# Patient Record
Sex: Female | Born: 1960 | Race: White | Hispanic: No | Marital: Married | Smoking: Never smoker
Health system: Southern US, Community
[De-identification: ages and names within clinical notes are randomized; demographics above are authoritative.]

## PROBLEM LIST (undated history)

## (undated) DIAGNOSIS — I10 Essential (primary) hypertension: Secondary | ICD-10-CM

## (undated) HISTORY — DX: Essential (primary) hypertension: I10

## (undated) HISTORY — PX: REDUCTION MAMMAPLASTY: SUR839

---

## 2006-07-24 ENCOUNTER — Other Ambulatory Visit: Admission: RE | Admit: 2006-07-24 | Discharge: 2006-07-24 | Payer: Self-pay | Admitting: Family Medicine

## 2007-02-28 ENCOUNTER — Ambulatory Visit (HOSPITAL_BASED_OUTPATIENT_CLINIC_OR_DEPARTMENT_OTHER): Admission: RE | Admit: 2007-02-28 | Discharge: 2007-02-28 | Payer: Self-pay | Admitting: Orthopedic Surgery

## 2008-09-17 ENCOUNTER — Other Ambulatory Visit: Admission: RE | Admit: 2008-09-17 | Discharge: 2008-09-17 | Payer: Self-pay | Admitting: Family Medicine

## 2011-01-18 NOTE — Op Note (Signed)
Jane Wong, Jane Wong NO.:  [id number]   MEDICAL RECORD NO.:  [id number]          PATIENT TYPE:  AMB   LOCATION:  DSC                          FACILITY:  [facility]   PHYSICIAN:  Cindee Salt, M.D.       DATE OF BIRTH:  08-06-1961   DATE OF PROCEDURE:  02/28/2007  DATE OF DISCHARGE:                               OPERATIVE REPORT   PREOPERATIVE DIAGNOSIS:  Carpal tunnel syndrome right hand, stenosing  tenosynovitis, right thumb.   POSTOPERATIVE DIAGNOSIS:  Carpal tunnel syndrome right hand, stenosing  tenosynovitis, right thumb.   OPERATION:  Release A1 pulley right thumb, carpal tunnel release right  hand.   SURGEON:  Cindee Salt, M.D.   ASSISTANT:  Carolyne Fiscal R.N.   ANESTHESIA:  Forearm based IV regional.   HISTORY:  The patient is a 50 year old female with history of carpal  tunnel syndrome, EMG nerve conductions positive.  This has not responded  to conservative treatment.  She also has triggering of her right thumb  which has also not responded, she is desirous of surgical release.  She  is aware of risks and complications including infection, recurrence,  injury to arteries, nerves, tendons, incomplete relief of symptoms,  dystrophy.  She has elected to proceed.  Questions have been encouraged  and answered in the preoperative area.  The extremity is marked by both  the patient and surgeon.  Antibiotic given, questions again encouraged  and answered.   PROCEDURE:  The patient was brought to the operating room where forearm  based IV regional anesthetic was carried out without difficulty.  She  was prepped using DuraPrep, supine position, right arm free.  A  longitudinal incision was made in the palm, carried down through  subcutaneous tissue.  Bleeders were electrocauterized palmar fascia was  split, superficial palmar arch identified, flexor tendon to the ring and  little finger identified, to the ulnar side of median nerve.  Carpal  retinaculum was incised  with sharp dissection.  A right-angle and Sewall  retractor were placed between skin and the forearm, the forearm fascia  was released for approximately a centimeter and half proximal to the  wrist crease under direct vision.  Canal was explored.  No further  lesions were identified.  Area compression to the nerve was apparent.  The motor branch was noted, found to be intact, wound was irrigated.  Skin closed interrupted 5-0 Vicryl Rapide sutures.  A separate incision  was then made over the A1 pulley of the right thumb, carried down  through subcutaneous tissues transverse just proximal to the  metacarpophalangeal joint crease  neurovascular bundles were identified  and protected.  Retractors placed.  An incision was then made on the  radial aspect of the A1 pulley.  The oblique pulley was left intact.  Thumb was placed through full range motion, no further triggering or  constriction was noted.  The wound was irrigated and skin closed with  interrupted 5-0 Vicryl Rapide  sutures.  Sterile compressive dressing and splint applied.  The patient  tolerated the procedure well was taken to the  recovery for observation  in satisfactory condition.  She is discharged home to return to [facility] [place] in one week on Vicodin.           ______________________________  Cindee Salt, M.D.     GK/MEDQ  D:  02/28/2007  T:  02/28/2007  Job:  [id number]

## 2011-06-22 LAB — POCT HEMOGLOBIN-HEMACUE
Hemoglobin: 13.4
Operator id: 116011

## 2014-11-27 ENCOUNTER — Other Ambulatory Visit: Payer: Self-pay | Admitting: Family Medicine

## 2014-11-27 ENCOUNTER — Other Ambulatory Visit (HOSPITAL_COMMUNITY)
Admission: RE | Admit: 2014-11-27 | Discharge: 2014-11-27 | Disposition: A | Discharge status: Home / Self Care | Payer: 59 | Source: Ambulatory Visit | Attending: Family Medicine | Admitting: Family Medicine

## 2014-11-27 DIAGNOSIS — Z01419 Encounter for gynecological examination (general) (routine) without abnormal findings: Secondary | ICD-10-CM | POA: Insufficient documentation

## 2014-11-28 LAB — CYTOLOGY - PAP

## 2015-02-13 ENCOUNTER — Other Ambulatory Visit: Payer: Self-pay | Admitting: Gastroenterology

## 2015-04-14 ENCOUNTER — Ambulatory Visit (INDEPENDENT_AMBULATORY_CARE_PROVIDER_SITE_OTHER): Payer: 59

## 2015-04-14 ENCOUNTER — Encounter: Payer: Self-pay | Admitting: Podiatry

## 2015-04-14 ENCOUNTER — Ambulatory Visit (INDEPENDENT_AMBULATORY_CARE_PROVIDER_SITE_OTHER): Payer: 59 | Admitting: Podiatry

## 2015-04-14 VITALS — BP 146/88 | HR 67 | Resp 12

## 2015-04-14 DIAGNOSIS — M7742 Metatarsalgia, left foot: Secondary | ICD-10-CM | POA: Diagnosis not present

## 2015-04-14 DIAGNOSIS — G5762 Lesion of plantar nerve, left lower limb: Secondary | ICD-10-CM | POA: Diagnosis not present

## 2015-04-14 DIAGNOSIS — M7741 Metatarsalgia, right foot: Secondary | ICD-10-CM | POA: Diagnosis not present

## 2015-04-14 DIAGNOSIS — R52 Pain, unspecified: Secondary | ICD-10-CM

## 2015-04-14 MED ORDER — DEXAMETHASONE SODIUM PHOSPHATE 120 MG/30ML IJ SOLN
4.0000 mg | Freq: Once | INTRAMUSCULAR | Status: AC
  Administered 2015-04-14: 4 mg via INTRA_ARTICULAR

## 2015-04-14 MED ORDER — TRIAMCINOLONE ACETONIDE 10 MG/ML IJ SUSP: 10.0000 mg | Freq: Once | INTRAMUSCULAR | Status: AC

## 2015-04-14 NOTE — Progress Notes (Signed)
   Subjective:    Patient ID: Jane Wong, female    DOB: Feb 01, 1961, 54 y.o.   MRN: [id number]  HPI  As patient presents today complaining of bilateral pain and numbness in the second through fourth MPJ toe area left greater than right. The symptoms occur upon physical activity such as tennis as well as walking. The symptoms are relieved when she sits down and rest over a period of several hours. Symptoms are aggravated with weightbearing and relieved with rest. She patient describes wearing appropriate shoes for the activity that she participates in. More recently she is reduce her tennis and walking because of foot pain. She denies any other areas on her body that have pain or numbness other than her feet. The symptoms are mildly progressing over time  Review of Systems  HENT: Positive for sneezing and sore throat.   Respiratory: Positive for cough.   All other systems reviewed and are negative.      Objective:   Physical Exam  Orientated 3  Vascular: No peripheral edema noted bilaterally DP and PT pulses 2/4 bilaterally Capillary reflex immediate bilaterally  Neurological: Sensation to 10 g monofilament wire intact 5/5 bilaterally Vibratory sensation reactive bilaterally Ankle reflex equal and reactive bilaterally  Dermatological: Texture and turgor within normal limits bilaterally Well-healed surgical scars dorsal aspect first MPJ bilaterally  Musculoskeletal: No restriction ankle, subtalar, midtarsal joints bilaterally Mild pain around upon range of motion second and fourth MPJ left with no crepitus or restriction Exquisite palpable tenderness second and third left intermetatarsal spaces with lesser tenderness in the second and third right intermetatarsal spaces. No palpable lesions in these intermetatarsal spaces bilaterally Palpable tenderness in the plantar sulcus area second third MPJ left greater than right without any palpable lesions  X-ray examination  today Retained internal fixation 2 first metatarsals bilaterally Hammertoe second left Posterior and inferior calcaneal spurs bilaterally Normal bone and joint spaces bilaterally       Assessment & Plan:   Assessment: Satisfactory neurovascular status Suspect Morton's neuroma third left interspace and neuroma second left intermetatarsal space Suspect Morton's neuroma third right intermetatarsal space and second right intermetatarsal space (however these areas are minimally tender to palpation without any palpable lesions) Metatarsalgia bilaterally  Plan: I reviewed the results of patient's exam and x-rays today Offered patient dexamethasone injection into the second third intermetatarsal spaces and she verbally consents. Skin is prepped with alcohol and Betadine and 2 mg of dexamethasone phosphate and 5 mg of plain Xylocaine were injected each second and third left intermetatarsal spaces for dexamethasone injection #1  Felt surgical metatarsal pads dispensed for patient to wear in her walking or running shoes with user instruction provided  We'll reevaluate 2 weeks. I do think the patient  candidate for accommodative orthotics metatarsal raises 2-4

## 2015-04-14 NOTE — Patient Instructions (Signed)
Morton's Neuroma Neuralgia (nerve pain) or neuroma (benign [non-cancerous] nerve tumor) may develop on any interdigital nerve. The interdigital nerves (nerves between digits) of the foot travel beneath and between the metatarsals (long bones of the fore foot) and pass the nerve endings to the toes. The third interdigital is a common place for a small neuroma to form called Morton's neuroma. Another nerve to be affected commonly is the fourth interdigital nerve. This would be in approximately in the area of the base or ball under the bottom of your fourth toe. This condition occurs more commonly in women and is usually on one side. It is usually first noticed by pain radiating (spreading) to the ball of the foot or to the toes. CAUSES The cause of interdigital neuralgia may be from low grade repetitive trauma (damage caused by an accident) as in activities causing a repeated pounding of the foot (running, jumping etc.). It is also caused by improper footwear or recent loss of the fatty padding on the bottom of the foot. TREATMENT  The condition often resolves (goes away) simply with decreasing activity if that is thought to be the cause. Proper shoes are beneficial. Orthotics (special foot support aids) such as a metatarsal bar are often beneficial. This condition usually responds to conservative therapy, however if surgery is necessary it usually brings complete relief. HOME CARE INSTRUCTIONS   Apply ice to the area of soreness for 15-20 minutes, 03-04 times per day, while awake for the first 2 days. Put ice in a plastic bag and place a towel between the bag of ice and your skin.  Only take over-the-counter or prescription medicines for pain, discomfort, or fever as directed by your caregiver. MAKE SURE YOU:   Understand these instructions.  Will watch your condition.  Will get help right away if you are not doing well or get worse. Document Released: 11/28/2000 Document Revised: 11/14/2011  Document Reviewed: 10/23/2013 [facility] Patient Information 2015 [place], [place]. This information is not intended to replace advice given to you by your health care provider. Make sure you discuss any questions you have with your health care provider.

## 2015-04-29 ENCOUNTER — Ambulatory Visit (INDEPENDENT_AMBULATORY_CARE_PROVIDER_SITE_OTHER): Payer: 59 | Admitting: Podiatry

## 2015-04-29 ENCOUNTER — Encounter: Payer: Self-pay | Admitting: Podiatry

## 2015-04-29 VITALS — BP 137/74 | HR 60 | Resp 12

## 2015-04-29 DIAGNOSIS — M7742 Metatarsalgia, left foot: Secondary | ICD-10-CM | POA: Diagnosis not present

## 2015-04-29 DIAGNOSIS — M722 Plantar fascial fibromatosis: Secondary | ICD-10-CM | POA: Diagnosis not present

## 2015-04-29 DIAGNOSIS — G5762 Lesion of plantar nerve, left lower limb: Secondary | ICD-10-CM

## 2015-04-29 DIAGNOSIS — M7741 Metatarsalgia, right foot: Secondary | ICD-10-CM | POA: Diagnosis not present

## 2015-04-29 NOTE — Patient Instructions (Signed)
Today we obtained a digital scan to make custom foot orthotics to treat your symptoms that seem to be most consistent with diffuse metatarsalgia (achiness in the ball areas of your feet). In addition you may have some low-grade symptoms of neuromas which are swollen nerves. Our office will contact you will receive the custom foot orthotics

## 2015-04-29 NOTE — Progress Notes (Signed)
   Subjective:    Patient ID: Jane Wong, female    DOB: [birth year], 54 y.o.   MRN: [id number]  HPI this patient presents for follow-up visit of 04/14/2015. At that time dexamethasone phosphate was injected in divided doses into the second and third left intermetatarsal spaces. Patient states for approximately 10 days she had complete relief. Now patient states that she has soreness and occasional shooting pain that occurs after she plays tennis or pulmonary prolonged walking or at toe off and a plantar lesser MPJ area. At this time her symptoms have improved partially from the dexamethasone injection, however, the symptoms seem to be gradually becoming more uncomfortable over time. Patient states that the metatarsal pads that were dispensed on the visit of 04/14/2015 felt comfortable and reduce some of the symptoms in the forefoot area  Review of Systems  All other systems reviewed and are negative.      Objective:   Physical Exam  Orientated 3  Vascular: DP and PT pulses 2/4 bilaterally  Neurological: Deferred  Dermatological: Texture and turgor within normal limits  Musculoskeletal: Palpable tenderness second third left intermetatarsal space without any palpable lesions Plantar basis second third MPJ left without any palpable lesions There is no tenderness on range of motion lesser MPJ left  Mild palpable tenderness second third right intermetatarsal spaces without any palpable lesions Mild palpable tenderness plantar basis second to fourth MPJ right without any palpable lesions There is no tenderness on range of motion MPJ right    Assessment & Plan:   Assessment: Mixed symptoms of metatarsalgia left greater than right Low-grade neuroma second and third intermetatarsal spaces left greater than right  Plan: I reviewed the results of today's examination responsive dexamethasone phosphate injection. At at this time I recommended an accommodative orthotic with a metatarsal  raise for the indication of metatarsalgia and Morton's neuroma bilaterally. patient verbally consents for accommodative orthotics  Rx custom foot orthotics Cushion sport No heel post Pelite blue cover perforated Metatarsal raise 2-4  Notify patient upon receipt of orthotic

## 2015-05-29 ENCOUNTER — Ambulatory Visit: Payer: 59 | Admitting: *Deleted

## 2015-05-29 DIAGNOSIS — R52 Pain, unspecified: Secondary | ICD-10-CM

## 2015-05-29 NOTE — Patient Instructions (Signed)

## 2015-05-29 NOTE — Progress Notes (Signed)
Patient ID: Jane Wong, female   DOB: 07-11-1961, 54 y.o.   MRN: [id number] Patient presents for orthotic pick up.  Verbal and written break in and wear instructions given.  Patient will follow up in 4 weeks if symptoms worsen or fail to improve.

## 2015-10-20 ENCOUNTER — Other Ambulatory Visit: Payer: Self-pay

## 2015-10-20 DIAGNOSIS — Z1231 Encounter for screening mammogram for malignant neoplasm of breast: Secondary | ICD-10-CM

## 2015-11-06 ENCOUNTER — Ambulatory Visit
Admission: RE | Admit: 2015-11-06 | Discharge: 2015-11-06 | Disposition: A | Discharge status: Home / Self Care | Payer: 59 | Source: Ambulatory Visit

## 2015-11-06 DIAGNOSIS — Z1231 Encounter for screening mammogram for malignant neoplasm of breast: Secondary | ICD-10-CM

## 2015-11-10 ENCOUNTER — Other Ambulatory Visit: Payer: Self-pay | Admitting: Family Medicine

## 2015-11-10 DIAGNOSIS — R928 Other abnormal and inconclusive findings on diagnostic imaging of breast: Secondary | ICD-10-CM

## 2015-11-16 ENCOUNTER — Ambulatory Visit
Admission: RE | Admit: 2015-11-16 | Discharge: 2015-11-16 | Disposition: A | Discharge status: Home / Self Care | Payer: 59 | Source: Ambulatory Visit | Attending: Family Medicine | Admitting: Family Medicine

## 2015-11-16 DIAGNOSIS — R928 Other abnormal and inconclusive findings on diagnostic imaging of breast: Secondary | ICD-10-CM

## 2016-09-05 DIAGNOSIS — R1084 Generalized abdominal pain: Secondary | ICD-10-CM | POA: Diagnosis not present

## 2016-09-07 DIAGNOSIS — K219 Gastro-esophageal reflux disease without esophagitis: Secondary | ICD-10-CM | POA: Diagnosis not present

## 2016-12-20 ENCOUNTER — Other Ambulatory Visit: Payer: Self-pay | Admitting: Family Medicine

## 2016-12-20 DIAGNOSIS — Z1231 Encounter for screening mammogram for malignant neoplasm of breast: Secondary | ICD-10-CM

## 2017-01-06 ENCOUNTER — Ambulatory Visit
Admission: RE | Admit: 2017-01-06 | Discharge: 2017-01-06 | Disposition: A | Discharge status: Home / Self Care | Payer: 59 | Source: Ambulatory Visit | Attending: Family Medicine | Admitting: Family Medicine

## 2017-01-06 DIAGNOSIS — Z1231 Encounter for screening mammogram for malignant neoplasm of breast: Secondary | ICD-10-CM | POA: Diagnosis not present

## 2017-04-13 DIAGNOSIS — M778 Other enthesopathies, not elsewhere classified: Secondary | ICD-10-CM | POA: Diagnosis not present

## 2017-05-16 ENCOUNTER — Ambulatory Visit (INDEPENDENT_AMBULATORY_CARE_PROVIDER_SITE_OTHER): Payer: 59 | Admitting: Podiatry

## 2017-05-16 ENCOUNTER — Other Ambulatory Visit: Payer: Self-pay | Admitting: Podiatry

## 2017-05-16 ENCOUNTER — Ambulatory Visit (INDEPENDENT_AMBULATORY_CARE_PROVIDER_SITE_OTHER): Payer: 59

## 2017-05-16 ENCOUNTER — Encounter: Payer: Self-pay | Admitting: Podiatry

## 2017-05-16 VITALS — BP 147/93 | HR 57

## 2017-05-16 DIAGNOSIS — R52 Pain, unspecified: Secondary | ICD-10-CM

## 2017-05-16 DIAGNOSIS — T847XXA Infection and inflammatory reaction due to other internal orthopedic prosthetic devices, implants and grafts, initial encounter: Secondary | ICD-10-CM

## 2017-05-16 DIAGNOSIS — M722 Plantar fascial fibromatosis: Secondary | ICD-10-CM

## 2017-05-16 NOTE — Progress Notes (Signed)
   Subjective:    Patient ID: Jane Wong, female    DOB: 1961/05/31, 56 y.o.   MRN: [id number]  HPI This patient presents today for 2 problems. The first complaint is a not with occasional discomfort in the incisional line on the dorsal right first MPJ area. This area areas intermittently uncomfortable with direct pressure shoe wearing. Patient has history of bilateral bunionectomy with internal K wire fixation approximately 10+ years ago. The second complaint is too weak history of inferior left heel pain uncomfortable with weightbearing and relieved with rest and elevation. Symptoms are exacerbated with physical activity. Patient has attempted to change shoes and stretching  persistent symptoms. Patient has pending your pain vacations scheduled   Review of Systems  All other systems reviewed and are negative.      Objective:   Physical Exam  Orientated 3  Vascular: DP and PT pulses 2/4 bilaterally Capillary reflex within normal limits bilaterally  Neurological: Sensation to 10 g monofilament wire intact 5/5 bilaterally Vibratory sensation reactive bilaterally Ankle reflexes reactive bilaterally  Dermatological: No skin lesions bilaterally Well-healed surgical scars dorsal first MPJ bilaterally Palpable thickening in the distal one third of the incisional site right first metatarsal which duplicates patient's area of discomfort  Musculoskeletal: Palpable tenderness medial plantar fascial insertional area left without any palpable lesions. This duplicates area of discomfort in the left heel  There is no restriction ankle, subtalar, midtarsal joints bilaterally  X-ray examination weightbearing left foot dated 05/16/2017  Intact bony structure without fracture and/or dislocation Retained internal K wires 2 had of first metatarsal Evidence of healed osteotomy first metatarsal Inferior calcaneal spur  Radiographic impression: No acute bony abnormality noted weightbearing  x-ray of the left foot dated 05/16/2017  X-ray examination weightbearing right foot  Intact bony structure without fracture and/or dislocation Retained internal K wire fixation 2 in good position Overlying circular metallic wires demonstrates the area of fibrosis or scarring overlies the K wires Evidence of healed osteotomy first metatarsal and bunionectomy Inferior calcaneal spur Posterior calcaneal spur  Radiographic impression: No acute bony abnormality weightbearing x-ray in the right foot Area of discomfort overlies the K wires which are in satisfactory position      Assessment & Plan:   Assessment: 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 I told her that if this area was consistent uncomfortable I would recommend removal of the K wires. At this time with patient's pending your pain vacations she will defer in any decision regards to removal of the K wires  Offered patient steroid injection for plantar fasciitis left. Patient verbally consents Skin is prepped with alcohol and Betadine and 10 mg of Kenalog injected with 5 mg of plain Xylocaine and 2.5 mg of plain Sensorcaine were injected inferior heel left for Kenalog injection #1. Patient tolerated procedure without any difficulty A fasciitis strap was dispensed to wear and left foot Shoeing and stretching discussed  Reappoint at patient's request

## 2017-05-16 NOTE — Patient Instructions (Signed)
Wear the fasciitis strap over a sock in athletic lace up style shoe Okay to wear existing orthotic plus the fasciitis strap If the internal fixation on the right foot is uncomfortable on a consistent basis and schedule appointment to have the internal fixation removed  Plantar Fasciitis Plantar fasciitis is a painful foot condition that affects the heel. It occurs when the band of tissue that connects the toes to the heel bone (plantar fascia) becomes irritated. This can happen after exercising too much or doing other repetitive activities (overuse injury). The pain from plantar fasciitis can range from mild irritation to severe pain that makes it difficult for you to walk or move. The pain is usually worse in the morning or after you have been sitting or lying down for a while. What are the causes? This condition may be caused by:  Standing for long periods of time.  Wearing shoes that do not fit.  Doing high-impact activities, including running, aerobics, and ballet.  Being overweight.  Having an abnormal way of walking (gait).  Having tight calf muscles.  Having high arches in your feet.  Starting a new athletic activity.  What are the signs or symptoms? The main symptom of this condition is heel pain. Other symptoms include:  Pain that gets worse after activity or exercise.  Pain that is worse in the morning or after resting.  Pain that goes away after you walk for a few minutes.  How is this diagnosed? This condition may be diagnosed based on your signs and symptoms. Your health care provider will also do a physical exam to check for:  A tender area on the bottom of your foot.  A high arch in your foot.  Pain when you move your foot.  Difficulty moving your foot.  You may also need to have imaging studies to confirm the diagnosis. These can include:  X-rays.  Ultrasound.  MRI.  How is this treated? Treatment for plantar fasciitis depends on the severity of  the condition. Your treatment may include:  Rest, ice, and over-the-counter pain medicines to manage your pain.  Exercises to stretch your calves and your plantar fascia.  A splint that holds your foot in a stretched, upward position while you sleep (night splint).  Physical therapy to relieve symptoms and prevent problems in the future.  Cortisone injections to relieve severe pain.  Extracorporeal shock wave therapy (ESWT) to stimulate damaged plantar fascia with electrical impulses. It is often used as a last resort before surgery.  Surgery, if other treatments have not worked after 12 months.  Follow these instructions at home:  Take medicines only as directed by your health care provider.  Avoid activities that cause pain.  Roll the bottom of your foot over a bag of ice or a bottle of cold water. Do this for 20 minutes, 3-4 times a day.  Perform simple stretches as directed by your health care provider.  Try wearing athletic shoes with air-sole or gel-sole cushions or soft shoe inserts.  Wear a night splint while sleeping, if directed by your health care provider.  Keep all follow-up appointments with your health care provider. How is this prevented?  Do not perform exercises or activities that cause heel pain.  Consider finding low-impact activities if you continue to have problems.  Lose weight if you need to. The best way to prevent plantar fasciitis is to avoid the activities that aggravate your plantar fascia. Contact a health care provider if:  Your symptoms do  not go away after treatment with home care measures.  Your pain gets worse.  Your pain affects your ability to move or do your daily activities. This information is not intended to replace advice given to you by your health care provider. Make sure you discuss any questions you have with your health care provider. Document Released: 05/17/2001 Document Revised: 01/25/2016 Document Reviewed:  07/02/2014 Elsevier Interactive Patient Education  [employer].

## 2017-06-29 DIAGNOSIS — M722 Plantar fascial fibromatosis: Secondary | ICD-10-CM | POA: Diagnosis not present

## 2017-07-03 DIAGNOSIS — M722 Plantar fascial fibromatosis: Secondary | ICD-10-CM | POA: Diagnosis not present

## 2017-07-06 DIAGNOSIS — S60012A Contusion of left thumb without damage to nail, initial encounter: Secondary | ICD-10-CM | POA: Diagnosis not present

## 2017-07-10 DIAGNOSIS — M722 Plantar fascial fibromatosis: Secondary | ICD-10-CM | POA: Diagnosis not present

## 2017-07-13 DIAGNOSIS — M722 Plantar fascial fibromatosis: Secondary | ICD-10-CM | POA: Diagnosis not present

## 2017-07-18 DIAGNOSIS — M722 Plantar fascial fibromatosis: Secondary | ICD-10-CM | POA: Diagnosis not present

## 2017-07-25 DIAGNOSIS — M722 Plantar fascial fibromatosis: Secondary | ICD-10-CM | POA: Diagnosis not present

## 2017-08-01 DIAGNOSIS — M722 Plantar fascial fibromatosis: Secondary | ICD-10-CM | POA: Diagnosis not present

## 2017-08-04 DIAGNOSIS — S60012D Contusion of left thumb without damage to nail, subsequent encounter: Secondary | ICD-10-CM | POA: Diagnosis not present

## 2017-08-08 DIAGNOSIS — M722 Plantar fascial fibromatosis: Secondary | ICD-10-CM | POA: Diagnosis not present

## 2017-08-15 DIAGNOSIS — M722 Plantar fascial fibromatosis: Secondary | ICD-10-CM | POA: Diagnosis not present

## 2017-08-21 DIAGNOSIS — M722 Plantar fascial fibromatosis: Secondary | ICD-10-CM | POA: Diagnosis not present

## 2017-09-06 DIAGNOSIS — M722 Plantar fascial fibromatosis: Secondary | ICD-10-CM | POA: Diagnosis not present

## 2017-09-13 DIAGNOSIS — M722 Plantar fascial fibromatosis: Secondary | ICD-10-CM | POA: Diagnosis not present

## 2017-09-21 DIAGNOSIS — M722 Plantar fascial fibromatosis: Secondary | ICD-10-CM | POA: Diagnosis not present

## 2017-10-02 DIAGNOSIS — M722 Plantar fascial fibromatosis: Secondary | ICD-10-CM | POA: Diagnosis not present

## 2017-10-27 DIAGNOSIS — M722 Plantar fascial fibromatosis: Secondary | ICD-10-CM | POA: Diagnosis not present

## 2017-10-30 ENCOUNTER — Other Ambulatory Visit: Payer: Self-pay | Admitting: Plastic Surgery

## 2017-10-30 DIAGNOSIS — B078 Other viral warts: Secondary | ICD-10-CM | POA: Diagnosis not present

## 2017-10-30 DIAGNOSIS — D2372 Other benign neoplasm of skin of left lower limb, including hip: Secondary | ICD-10-CM | POA: Diagnosis not present

## 2018-02-09 ENCOUNTER — Other Ambulatory Visit: Payer: Self-pay | Admitting: Family Medicine

## 2018-02-09 DIAGNOSIS — Z1231 Encounter for screening mammogram for malignant neoplasm of breast: Secondary | ICD-10-CM

## 2018-02-12 ENCOUNTER — Ambulatory Visit
Admission: RE | Admit: 2018-02-12 | Discharge: 2018-02-12 | Disposition: A | Discharge status: Home / Self Care | Payer: 59 | Source: Ambulatory Visit | Attending: Family Medicine | Admitting: Family Medicine

## 2018-02-12 DIAGNOSIS — Z1231 Encounter for screening mammogram for malignant neoplasm of breast: Secondary | ICD-10-CM | POA: Diagnosis not present

## 2018-02-22 DIAGNOSIS — I10 Essential (primary) hypertension: Secondary | ICD-10-CM | POA: Diagnosis not present

## 2018-05-29 DIAGNOSIS — S60012D Contusion of left thumb without damage to nail, subsequent encounter: Secondary | ICD-10-CM | POA: Diagnosis not present

## 2018-05-29 DIAGNOSIS — M67432 Ganglion, left wrist: Secondary | ICD-10-CM | POA: Diagnosis not present

## 2018-05-29 DIAGNOSIS — S60019A Contusion of unspecified thumb without damage to nail, initial encounter: Secondary | ICD-10-CM | POA: Insufficient documentation

## 2018-06-13 DIAGNOSIS — M67432 Ganglion, left wrist: Secondary | ICD-10-CM | POA: Diagnosis not present

## 2018-06-19 DIAGNOSIS — M67432 Ganglion, left wrist: Secondary | ICD-10-CM | POA: Diagnosis not present

## 2018-07-11 ENCOUNTER — Other Ambulatory Visit: Payer: Self-pay

## 2018-07-11 DIAGNOSIS — M67432 Ganglion, left wrist: Secondary | ICD-10-CM | POA: Diagnosis not present

## 2018-07-25 DIAGNOSIS — Z5189 Encounter for other specified aftercare: Secondary | ICD-10-CM | POA: Insufficient documentation

## 2018-07-26 DIAGNOSIS — M67432 Ganglion, left wrist: Secondary | ICD-10-CM | POA: Diagnosis not present

## 2018-10-09 DIAGNOSIS — M6240 Contracture of muscle, unspecified site: Secondary | ICD-10-CM | POA: Diagnosis not present

## 2018-10-09 DIAGNOSIS — M9907 Segmental and somatic dysfunction of upper extremity: Secondary | ICD-10-CM | POA: Diagnosis not present

## 2018-10-11 DIAGNOSIS — M6240 Contracture of muscle, unspecified site: Secondary | ICD-10-CM | POA: Diagnosis not present

## 2018-10-11 DIAGNOSIS — M9907 Segmental and somatic dysfunction of upper extremity: Secondary | ICD-10-CM | POA: Diagnosis not present

## 2018-10-16 DIAGNOSIS — M6240 Contracture of muscle, unspecified site: Secondary | ICD-10-CM | POA: Diagnosis not present

## 2018-10-16 DIAGNOSIS — M9907 Segmental and somatic dysfunction of upper extremity: Secondary | ICD-10-CM | POA: Diagnosis not present

## 2018-10-18 DIAGNOSIS — M6240 Contracture of muscle, unspecified site: Secondary | ICD-10-CM | POA: Diagnosis not present

## 2018-10-18 DIAGNOSIS — M9907 Segmental and somatic dysfunction of upper extremity: Secondary | ICD-10-CM | POA: Diagnosis not present

## 2018-10-25 DIAGNOSIS — M9907 Segmental and somatic dysfunction of upper extremity: Secondary | ICD-10-CM | POA: Diagnosis not present

## 2018-10-25 DIAGNOSIS — M6240 Contracture of muscle, unspecified site: Secondary | ICD-10-CM | POA: Diagnosis not present

## 2018-10-31 DIAGNOSIS — M9907 Segmental and somatic dysfunction of upper extremity: Secondary | ICD-10-CM | POA: Diagnosis not present

## 2018-10-31 DIAGNOSIS — M6240 Contracture of muscle, unspecified site: Secondary | ICD-10-CM | POA: Diagnosis not present

## 2018-11-05 ENCOUNTER — Other Ambulatory Visit (HOSPITAL_COMMUNITY)
Admission: RE | Admit: 2018-11-05 | Discharge: 2018-11-05 | Disposition: A | Discharge status: Home / Self Care | Payer: 59 | Source: Ambulatory Visit | Attending: Family Medicine | Admitting: Family Medicine

## 2018-11-05 ENCOUNTER — Other Ambulatory Visit: Payer: Self-pay | Admitting: Family Medicine

## 2018-11-05 DIAGNOSIS — Z0000 Encounter for general adult medical examination without abnormal findings: Secondary | ICD-10-CM | POA: Diagnosis not present

## 2018-11-05 DIAGNOSIS — Z124 Encounter for screening for malignant neoplasm of cervix: Secondary | ICD-10-CM | POA: Diagnosis not present

## 2018-11-05 DIAGNOSIS — E7800 Pure hypercholesterolemia, unspecified: Secondary | ICD-10-CM | POA: Diagnosis not present

## 2018-11-05 DIAGNOSIS — I10 Essential (primary) hypertension: Secondary | ICD-10-CM | POA: Diagnosis not present

## 2018-11-05 DIAGNOSIS — Z23 Encounter for immunization: Secondary | ICD-10-CM | POA: Diagnosis not present

## 2018-11-09 LAB — CYTOLOGY - PAP: HPV: NOT DETECTED

## 2018-11-20 DIAGNOSIS — R87619 Unspecified abnormal cytological findings in specimens from cervix uteri: Secondary | ICD-10-CM | POA: Diagnosis not present

## 2018-11-21 ENCOUNTER — Other Ambulatory Visit: Payer: Self-pay | Admitting: Obstetrics and Gynecology

## 2018-11-21 DIAGNOSIS — N888 Other specified noninflammatory disorders of cervix uteri: Secondary | ICD-10-CM | POA: Diagnosis not present

## 2018-11-21 DIAGNOSIS — N841 Polyp of cervix uteri: Secondary | ICD-10-CM | POA: Diagnosis not present

## 2018-12-03 DIAGNOSIS — R87619 Unspecified abnormal cytological findings in specimens from cervix uteri: Secondary | ICD-10-CM | POA: Diagnosis not present

## 2019-03-01 ENCOUNTER — Other Ambulatory Visit: Payer: Self-pay | Admitting: Family Medicine

## 2019-03-01 DIAGNOSIS — Z1231 Encounter for screening mammogram for malignant neoplasm of breast: Secondary | ICD-10-CM

## 2019-04-12 ENCOUNTER — Ambulatory Visit: Payer: 59

## 2019-04-18 DIAGNOSIS — M19049 Primary osteoarthritis, unspecified hand: Secondary | ICD-10-CM | POA: Insufficient documentation

## 2019-05-27 ENCOUNTER — Other Ambulatory Visit (HOSPITAL_COMMUNITY)
Admission: RE | Admit: 2019-05-27 | Discharge: 2019-05-27 | Disposition: A | Discharge status: Home / Self Care | Payer: 59 | Source: Ambulatory Visit | Attending: Obstetrics and Gynecology | Admitting: Obstetrics and Gynecology

## 2019-05-27 ENCOUNTER — Other Ambulatory Visit: Payer: Self-pay | Admitting: Obstetrics and Gynecology

## 2019-05-27 DIAGNOSIS — R87619 Unspecified abnormal cytological findings in specimens from cervix uteri: Secondary | ICD-10-CM | POA: Insufficient documentation

## 2019-06-06 LAB — CYTOLOGY - PAP
Diagnosis: NEGATIVE
High risk HPV: NEGATIVE
Molecular Disclaimer: 56
Molecular Disclaimer: NORMAL

## 2019-08-23 ENCOUNTER — Encounter: Payer: Self-pay | Admitting: Podiatry

## 2019-08-23 ENCOUNTER — Ambulatory Visit (INDEPENDENT_AMBULATORY_CARE_PROVIDER_SITE_OTHER): Payer: 59 | Admitting: Podiatry

## 2019-08-23 ENCOUNTER — Other Ambulatory Visit: Payer: Self-pay

## 2019-08-23 DIAGNOSIS — L600 Ingrowing nail: Secondary | ICD-10-CM | POA: Diagnosis not present

## 2019-08-23 DIAGNOSIS — Z472 Encounter for removal of internal fixation device: Secondary | ICD-10-CM

## 2019-08-23 MED ORDER — NEOMYCIN-POLYMYXIN-HC 3.5-10000-1 OT SOLN: OTIC | 0 refills | Status: AC

## 2019-08-23 NOTE — Patient Instructions (Signed)

## 2019-08-26 NOTE — Progress Notes (Signed)
Subjective:   Patient ID: Jane Wong, female   DOB: 58 y.o.   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  Explained procedure risk and patient wants surgery and today I went ahead and I infiltrated the hallux of each toe 60 mg like Marcaine mixture sterile prep applied bilateral and using sterile instrumentation I remove the borders exposed matrix and applied phenol 3 applications 30 seconds followed by alcohol lavage sterile dressing.  Gave instructions on soaks and reappoint

## 2019-09-20 ENCOUNTER — Ambulatory Visit (INDEPENDENT_AMBULATORY_CARE_PROVIDER_SITE_OTHER): Payer: 59

## 2019-09-20 ENCOUNTER — Other Ambulatory Visit: Payer: Self-pay | Admitting: Podiatry

## 2019-09-20 ENCOUNTER — Encounter: Payer: Self-pay | Admitting: Podiatry

## 2019-09-20 ENCOUNTER — Ambulatory Visit (INDEPENDENT_AMBULATORY_CARE_PROVIDER_SITE_OTHER): Payer: 59 | Admitting: Podiatry

## 2019-09-20 ENCOUNTER — Other Ambulatory Visit: Payer: Self-pay

## 2019-09-20 DIAGNOSIS — Z472 Encounter for removal of internal fixation device: Secondary | ICD-10-CM

## 2019-09-20 DIAGNOSIS — L600 Ingrowing nail: Secondary | ICD-10-CM

## 2019-09-20 DIAGNOSIS — M79671 Pain in right foot: Secondary | ICD-10-CM

## 2019-09-20 NOTE — Progress Notes (Signed)
Subjective:   Patient ID: Jane Wong, female   DOB: 59 y.o.   MRN: [id number]   HPI Patient presents stating that I been doing well with my ingrown toenails but I have this prominence on my right foot that gets aggravated and I need to have what ever skin there removed after having had bunion surgery 10 years ago   ROS      Objective:  Physical Exam  Neurovascular status intact with ingrown toenails healed well with some crusted tissue which I cleaned up with patient noted to have prominence on the first metatarsal shaft right that is painful when palpated     Assessment:  Probability for pin that lifted causing irritation of tissue     Plan:  Reviewed condition and recommended pin removal and allowed patient to read consent form for pin removal.  Explained procedure risk and patient wants surgery at this time is scheduled for this to be done in the office and there is 2 pins and if possible I removed to and I did explain the 10-year history that it may be difficult to remove and it is possible we will have to take her to the surgical center.  She signed consent form and is scheduled for the procedure  X-rays indicate that there is elevation of the pin creating irritation of tissue

## 2019-09-23 ENCOUNTER — Telehealth: Payer: Self-pay | Admitting: Podiatry

## 2019-09-23 NOTE — Telephone Encounter (Signed)
DATE OF OFFICE SURGERY: 10/09/2019  SURGICAL PROCEDURE: Removal of pin right first metatarsal [id number]).  [employer] Effective 09/06/2019 -  Family Deductible is $3,500 with $17.45 met and $2,982.55 remaining. Family Out of Pocket is [phone] with $17.45 met and $6,832.55 remaining. CoInsurance is 80% / 20%.   Notification or Prior Authorization is not required for the requested services  This [employer] plan does not currently require a prior authorization for these services. If you have general questions about the prior authorization requirements, please call us at [phone] or visit [email or web address] > Clinician Resources > Advance and Admission Notification Requirements. The number above acknowledges your notification. Please write this number down for future reference. Notification is not a guarantee of coverage or payment.  Decision ID #:[id number]  The number above acknowledges your inquiry and our response. Please write this number down and refer to it for future inquiries. Coverage and payment for an item or service is governed by the member's benefit plan document, and, if applicable, the provider's participation agreement with the Health Plan.

## 2019-10-09 ENCOUNTER — Encounter: Payer: Self-pay | Admitting: Podiatry

## 2019-10-09 ENCOUNTER — Ambulatory Visit (INDEPENDENT_AMBULATORY_CARE_PROVIDER_SITE_OTHER): Payer: 59 | Admitting: Podiatry

## 2019-10-09 ENCOUNTER — Other Ambulatory Visit: Payer: Self-pay

## 2019-10-09 VITALS — BP 123/64 | HR 58 | Temp 97.3°F | Resp 16

## 2019-10-09 DIAGNOSIS — Z472 Encounter for removal of internal fixation device: Secondary | ICD-10-CM | POA: Diagnosis not present

## 2019-10-09 NOTE — Progress Notes (Signed)
Subjective:   Patient ID: Jane Wong, female   DOB: 59 y.o.   MRN: [id number]   HPI Patient presents stating that he needs pins removed from the right foot and states that she has been very sore when she tries to wear shoes   ROS      Objective:  Physical Exam  Neurovascular status intact with patient found to have prominent pin position x2 right first metatarsal that is painful with shoe gear     Assessment:  Chronic abnormal pin position right that has probably developed inflammatory changes     Plan:  H&P I then went ahead and anesthetized 60 mg like Marcaine mixture.  Patient is brought to the OR placed in supine position and the patient's right foot was prepped and draped utilizing standard aseptic technique.  The patient's right foot was exsanguinated utilizing Ace wrap and the right ankle tourniquet inflated 200 mmHg and the following procedures were performed.  Attention was directed dorsal aspect right foot where a proximal 2 cm incision was made over the prominent pin position.  The incision was deepened through subcutaneous tissue down to capsule and I found a small nodule which I excised.  I then identified the proximal pin utilizing sharp blunt dissection I removed the pin in toto.  I flushed the wound copious amounts Terramycin solution and closure will follow the following procedure.  Procedure #2 removal of distal pin right tendon was directed to the incision site which was extended distal.  The incision was deepened through subcu tissue down to capsule and a linear capsular incision was performed.  Capsular tissues sharply dissected off the underlying bone and the distal pin was identified and removed in toto.  The wound was flushed copious amounts Garamycin solution sutured with 4-0 nylon sterile dressing applied tourniquet released and capillary fill is noted to be immediate to all digits on the right foot.  Patient left the OR in satisfactory condition and will be seen  back in 2 weeks or earlier if needed

## 2019-10-23 ENCOUNTER — Encounter: Payer: 59 | Admitting: Podiatry

## 2019-10-24 ENCOUNTER — Encounter: Payer: 59 | Admitting: Podiatry

## 2019-10-25 ENCOUNTER — Ambulatory Visit (INDEPENDENT_AMBULATORY_CARE_PROVIDER_SITE_OTHER): Payer: 59

## 2019-10-25 ENCOUNTER — Other Ambulatory Visit: Payer: Self-pay

## 2019-10-25 ENCOUNTER — Ambulatory Visit (INDEPENDENT_AMBULATORY_CARE_PROVIDER_SITE_OTHER): Payer: 59 | Admitting: Podiatry

## 2019-10-25 DIAGNOSIS — M79671 Pain in right foot: Secondary | ICD-10-CM

## 2019-10-25 DIAGNOSIS — Z472 Encounter for removal of internal fixation device: Secondary | ICD-10-CM

## 2019-10-30 ENCOUNTER — Encounter: Payer: Self-pay | Admitting: Podiatry

## 2019-10-30 NOTE — Progress Notes (Signed)
  Subjective:  Patient ID: Jane Wong, female    DOB: 23-Sep-1960,  MRN: [id number]  Chief Complaint  Patient presents with  . Routine Post Op    office surgery pov#1 dos 02.03.2021 Removal of Pin x2 1st Metatarsal Right, pt has no pain, only tightness around incision point. no signs of infections, pt has no other comments or concerns      59 y.o. female returns for post-op check.  Patient states that she is doing well.  She does not have any acute complaints.  There is some tightness around the incision site however overall healing really well.  No clinical signs of infection noted.  Patient has been wearing her surgical shoe and keeping the bandages clean dry and intact.  Review of Systems: Negative except as noted in the HPI. Denies N/V/F/Ch.  Past Medical History:  Diagnosis Date  . Hypertension     Current Outpatient Medications:  .  B Complex-C (SUPER B COMPLEX PO), Take by mouth., Disp: , Rfl:  .  Calcium Carb-Cholecalciferol (CALCIUM 600 + D PO), Take by mouth., Disp: , Rfl:  .  cetirizine (ZYRTEC) 10 MG tablet, Take 10 mg by mouth daily., Disp: , Rfl:  .  lisinopril (PRINIVIL,ZESTRIL) 20 MG tablet, 1 TABLET ONCE A DAY ORALLY 30 DAY(S), Disp: , Rfl: 1 .  neomycin-polymyxin-hydrocortisone (CORTISPORIN) OTIC solution, Apply 1-2 drops to toe after soaking twice a day, Disp: 10 mL, Rfl: 0 .  TRILYTE 420 G solution, See admin instructions., Disp: , Rfl: 0  Current Facility-Administered Medications:  .  triamcinolone acetonide (KENALOG) 10 MG/ML injection 10 mg, 10 mg, Other, Once, Tuchman, Leslye Peer, DPM  Social History   Tobacco Use  Smoking Status Never Smoker  Smokeless Tobacco Never Used    Allergies  Allergen Reactions  . Penicillins    Objective:  There were no vitals filed for this visit. There is no height or weight on file to calculate BMI. Constitutional Well developed. Well nourished.  Vascular Foot warm and well perfused. Capillary refill normal to all  digits.   Neurologic Normal speech. Oriented to person, place, and time. Epicritic sensation to light touch grossly present bilaterally.  Dermatologic Skin healing well without signs of infection. Skin edges well coapted without signs of infection.  Orthopedic: Tenderness to palpation noted about the surgical site.   Radiographs: None Assessment:   1. Encounter for removal of internal fixation device   2. Right foot pain    Plan:  Patient was evaluated and treated and all questions answered.  S/p foot surgery right -Progressing as expected post-operatively. -XR: None -WB Status: Weightbearing as tolerated in surgical shoe -Sutures: Sutures were taken out without any signs of dehiscence.  Steri-Strip was applied -Medications: None -Patient will keep wearing the surgical shoe and follow-up with Dr. Paulla Dolly.  Return in about 10 days (around 11/04/2019).

## 2019-11-06 ENCOUNTER — Ambulatory Visit (INDEPENDENT_AMBULATORY_CARE_PROVIDER_SITE_OTHER): Payer: 59

## 2019-11-06 ENCOUNTER — Encounter: Payer: Self-pay | Admitting: Podiatry

## 2019-11-06 ENCOUNTER — Ambulatory Visit (INDEPENDENT_AMBULATORY_CARE_PROVIDER_SITE_OTHER): Payer: 59 | Admitting: Podiatry

## 2019-11-06 ENCOUNTER — Other Ambulatory Visit: Payer: Self-pay

## 2019-11-06 DIAGNOSIS — Z9889 Other specified postprocedural states: Secondary | ICD-10-CM

## 2019-11-06 DIAGNOSIS — Z472 Encounter for removal of internal fixation device: Secondary | ICD-10-CM

## 2019-11-06 NOTE — Progress Notes (Signed)
Subjective:   Patient ID: Jane Wong, female   DOB: 59 y.o.   MRN: [id number]   HPI Patient presents stating healing well from procedure   ROS      Objective:  Physical Exam  Neurovascular status intact negative Bevelyn Buckles' sign noted wound edges well coapted stitches intact     Assessment:  Doing well post removal of 2 pins dorsal right foot     Plan:  Advised on continued compression and no stitches present and I advised this patient on return to normal shoe gear and reappoint as needed

## 2020-01-07 ENCOUNTER — Ambulatory Visit
Admission: RE | Admit: 2020-01-07 | Discharge: 2020-01-07 | Disposition: A | Discharge status: Home / Self Care | Payer: 59 | Source: Ambulatory Visit | Attending: Family Medicine | Admitting: Family Medicine

## 2020-01-07 ENCOUNTER — Other Ambulatory Visit: Payer: Self-pay

## 2020-01-07 DIAGNOSIS — Z1231 Encounter for screening mammogram for malignant neoplasm of breast: Secondary | ICD-10-CM

## 2020-01-16 ENCOUNTER — Ambulatory Visit (INDEPENDENT_AMBULATORY_CARE_PROVIDER_SITE_OTHER): Payer: 59

## 2020-01-16 ENCOUNTER — Other Ambulatory Visit: Payer: Self-pay | Admitting: Podiatry

## 2020-01-16 ENCOUNTER — Encounter: Payer: Self-pay | Admitting: Podiatry

## 2020-01-16 ENCOUNTER — Other Ambulatory Visit: Payer: Self-pay

## 2020-01-16 ENCOUNTER — Ambulatory Visit (INDEPENDENT_AMBULATORY_CARE_PROVIDER_SITE_OTHER): Payer: 59 | Admitting: Podiatry

## 2020-01-16 VITALS — Temp 97.6°F

## 2020-01-16 DIAGNOSIS — Z9889 Other specified postprocedural states: Secondary | ICD-10-CM

## 2020-01-16 DIAGNOSIS — M76821 Posterior tibial tendinitis, right leg: Secondary | ICD-10-CM | POA: Diagnosis not present

## 2020-01-16 DIAGNOSIS — Z472 Encounter for removal of internal fixation device: Secondary | ICD-10-CM | POA: Diagnosis not present

## 2020-01-16 DIAGNOSIS — M76822 Posterior tibial tendinitis, left leg: Secondary | ICD-10-CM

## 2020-01-16 NOTE — Progress Notes (Signed)
Subjective:   Patient ID: Jane Wong, female   DOB: 60 y.o.   MRN: [id number]   HPI Patient states she was playing tennis in March and her foot stuck and she twisted and she is had a lot of pain on the inside of the ankle that is been not getting better over the last couple months.  Patient is tried to reduce activities and ice without relief   ROS      Objective:  Physical Exam  Neurovascular status intact with inflammation pain at the posterior tibial insertion left with no indication of muscle dysfunction or tear.  It is painful with swelling noted     Assessment:  Probability for posterior tibial tendinitis left with inflammation     Plan:  X-ray reviewed prep done and carefully injected the insertion of the posterior tib left 3 mg Dexasone Kenalog 5 mg Xylocaine and applied fascial brace to lift up the arch and discussed continued support orthotics and reappoint as needed  X-rays indicate that there is inflammation around the area but no signs of bony injury or fracture associated with the sprain that she had

## 2020-02-17 ENCOUNTER — Ambulatory Visit (INDEPENDENT_AMBULATORY_CARE_PROVIDER_SITE_OTHER): Payer: 59 | Admitting: Podiatry

## 2020-02-17 ENCOUNTER — Other Ambulatory Visit: Payer: Self-pay

## 2020-02-17 ENCOUNTER — Encounter: Payer: Self-pay | Admitting: Podiatry

## 2020-02-17 DIAGNOSIS — M76822 Posterior tibial tendinitis, left leg: Secondary | ICD-10-CM | POA: Diagnosis not present

## 2020-02-17 MED ORDER — DICLOFENAC SODIUM 75 MG PO TBEC: 75.0000 mg | DELAYED_RELEASE_TABLET | Freq: Two times a day (BID) | ORAL | 2 refills | Status: DC

## 2020-02-17 NOTE — Progress Notes (Signed)
Subjective:   Patient ID: Jane Wong, female   DOB: 59 y.o.   MRN: [id number]   HPI Patient presents stating she was doing pretty well and that her pain has come back more in her left foot recently and making it hard for her to be active.  She wants to be able to play sports and this keeps her from doing it   ROS      Objective:  Physical Exam  Neurovascular status intact with continued exquisite discomfort posterior tibial tendon as it inserts into the navicular bone left     Assessment:  Posterior tibial tendinitis left with inflammation fluid buildup     Plan:  Reviewed structure of the foot long-term orthotics but first we need to get symptoms under control and I went ahead and explained posterior tibial tendinitis of a chronic nature.  At this point working to start her on aggressive ice she will utilize oral anti-inflammatories and I went ahead and I applied air fracture walker to completely immobilize and let it rest.  Patient will be seen back and hopefully will be able to get her into orthotics and also at this time I am scheduling for physical therapy to try to work at reducing the inflammation and strengthening the posterior tibial tendon.  Discussed MRI if symptoms do not improve

## 2020-03-16 ENCOUNTER — Encounter: Payer: Self-pay | Admitting: Podiatry

## 2020-03-16 ENCOUNTER — Ambulatory Visit (INDEPENDENT_AMBULATORY_CARE_PROVIDER_SITE_OTHER): Payer: 59 | Admitting: Podiatry

## 2020-03-16 ENCOUNTER — Other Ambulatory Visit: Payer: Self-pay

## 2020-03-16 DIAGNOSIS — M76822 Posterior tibial tendinitis, left leg: Secondary | ICD-10-CM

## 2020-03-16 NOTE — Progress Notes (Signed)
Subjective:   Patient ID: Jane Wong, female   DOB: 59 y.o.   MRN: [id number]   HPI Patient states it seems my ankle is improving still painful and I cannot really gone without the boot so I cannot say for sure   ROS      Objective:  Physical Exam  Neurovascular status intact with posterior tibial tendon left that appears to be functioning well with no indication of tear but still some discomfort insertion navicular     Assessment:  Posterior tibial tendinitis left with possibility for rupture or tear     Plan:  H&P reviewed the improvement in that we want to continue this course and if it does start to get painful again we will need to get MRI.  Gradual reduction in mood over the next 4 weeks with increased activity and continue physical therapy for 1-2 more weeks

## 2020-04-13 ENCOUNTER — Other Ambulatory Visit: Payer: Self-pay

## 2020-04-13 ENCOUNTER — Ambulatory Visit (INDEPENDENT_AMBULATORY_CARE_PROVIDER_SITE_OTHER): Payer: 59 | Admitting: Podiatry

## 2020-04-13 ENCOUNTER — Encounter: Payer: Self-pay | Admitting: Podiatry

## 2020-04-13 DIAGNOSIS — M76822 Posterior tibial tendinitis, left leg: Secondary | ICD-10-CM

## 2020-04-13 DIAGNOSIS — M76821 Posterior tibial tendinitis, right leg: Secondary | ICD-10-CM

## 2020-04-13 NOTE — Progress Notes (Signed)
Subjective:   Patient ID: Jane Wong, female   DOB: 59 y.o.   MRN: [id number]   HPI Patient states she is still getting pain on the side of her left foot and while it is improved she cannot play tennis or do activities she wants to do.  She is doing physical therapy and is to start dry needling   ROS      Objective:  Physical Exam  Neurovascular status intact with pain still persistent on the left posterior tib tendon as it gets near the navicular with inflammation but no indications of abnormal function of the tendon     Assessment:  Posterior tib tendinitis left present and improved but still keeping her from doing activities     Plan:  She will do her physical therapy and I am going to go ahead do orthotics to try to lift up her arches and allow her to be more active and I also discussed with her the possibility for MRI if symptoms persist to rule out interstitial tear of the tendon.  Today she is casted for functional orthotics by ped orthotist

## 2020-05-12 ENCOUNTER — Other Ambulatory Visit: Payer: Self-pay

## 2020-05-12 ENCOUNTER — Ambulatory Visit: Payer: 59 | Admitting: Orthotics

## 2020-05-12 DIAGNOSIS — Z472 Encounter for removal of internal fixation device: Secondary | ICD-10-CM

## 2020-05-12 DIAGNOSIS — M76822 Posterior tibial tendinitis, left leg: Secondary | ICD-10-CM

## 2020-05-12 DIAGNOSIS — Z9889 Other specified postprocedural states: Secondary | ICD-10-CM

## 2020-05-12 NOTE — Progress Notes (Signed)
Patient came in today to pick up custom made foot orthotics.  The goals were accomplished and the patient reported no dissatisfaction with said orthotics.  Patient was advised of breakin period and how to report any issues. 

## 2020-05-27 ENCOUNTER — Ambulatory Visit: Payer: 59 | Admitting: Podiatry

## 2020-06-03 ENCOUNTER — Other Ambulatory Visit: Payer: Self-pay

## 2020-06-03 ENCOUNTER — Ambulatory Visit (INDEPENDENT_AMBULATORY_CARE_PROVIDER_SITE_OTHER): Payer: 59 | Admitting: Podiatry

## 2020-06-03 ENCOUNTER — Encounter: Payer: Self-pay | Admitting: Podiatry

## 2020-06-03 DIAGNOSIS — M76822 Posterior tibial tendinitis, left leg: Secondary | ICD-10-CM | POA: Diagnosis not present

## 2020-06-03 NOTE — Progress Notes (Signed)
Subjective:   Patient ID: Jane Wong, female   DOB: 59 y.o.   MRN: [id number]   HPI Patient presents concerns about the left insert and states she still has mild to moderate discomfort after physical therapy and treatments we had given   ROS      Objective:  Physical Exam  Neurovascular status intact with inflammation of a mild nature of the posterior tibial tendon left with significant improvement from previous but still present upon deep palpation     Assessment:  Posterior tibial tendinitis improved left but present     Plan:  We discussed changes in activity levels and will stop doing the treadmill and will start outside walking and we may consider modification of orthotic if symptoms were to persist.  Patient will be seen back but at this time it does not appear to be cutting into her foot so we will get a hold off and try to change activities before considering other types of treatment

## 2020-11-26 ENCOUNTER — Other Ambulatory Visit: Payer: Self-pay | Admitting: Obstetrics and Gynecology

## 2020-11-26 DIAGNOSIS — N63 Unspecified lump in unspecified breast: Secondary | ICD-10-CM

## 2020-12-18 ENCOUNTER — Ambulatory Visit
Admission: RE | Admit: 2020-12-18 | Discharge: 2020-12-18 | Disposition: A | Discharge status: Home / Self Care | Payer: 59 | Source: Ambulatory Visit | Attending: Obstetrics and Gynecology | Admitting: Obstetrics and Gynecology

## 2020-12-18 ENCOUNTER — Other Ambulatory Visit: Payer: Self-pay

## 2020-12-18 ENCOUNTER — Ambulatory Visit: Admission: RE | Admit: 2020-12-18 | Payer: 59 | Source: Ambulatory Visit

## 2020-12-18 DIAGNOSIS — N63 Unspecified lump in unspecified breast: Secondary | ICD-10-CM

## 2021-01-04 ENCOUNTER — Other Ambulatory Visit: Payer: Self-pay | Admitting: Family Medicine

## 2021-01-04 DIAGNOSIS — Z1231 Encounter for screening mammogram for malignant neoplasm of breast: Secondary | ICD-10-CM

## 2021-03-10 ENCOUNTER — Ambulatory Visit
Admission: RE | Admit: 2021-03-10 | Discharge: 2021-03-10 | Disposition: A | Discharge status: Home / Self Care | Payer: 59 | Source: Ambulatory Visit | Attending: Family Medicine | Admitting: Family Medicine

## 2021-03-10 ENCOUNTER — Other Ambulatory Visit: Payer: Self-pay

## 2021-03-10 DIAGNOSIS — Z1231 Encounter for screening mammogram for malignant neoplasm of breast: Secondary | ICD-10-CM

## 2021-08-20 ENCOUNTER — Other Ambulatory Visit: Payer: Self-pay

## 2021-08-20 ENCOUNTER — Ambulatory Visit (INDEPENDENT_AMBULATORY_CARE_PROVIDER_SITE_OTHER): Payer: 59 | Admitting: Podiatry

## 2021-08-20 ENCOUNTER — Ambulatory Visit (INDEPENDENT_AMBULATORY_CARE_PROVIDER_SITE_OTHER): Payer: 59

## 2021-08-20 DIAGNOSIS — M76822 Posterior tibial tendinitis, left leg: Secondary | ICD-10-CM

## 2021-08-20 DIAGNOSIS — T148XXA Other injury of unspecified body region, initial encounter: Secondary | ICD-10-CM | POA: Diagnosis not present

## 2021-08-21 NOTE — Progress Notes (Signed)
Subjective:   Patient ID: Jane Wong, female   DOB: 60 y.o.   MRN: [id number]   HPI Patient presents stating she is having a lot of pain in her left ankle and admits that when we saw her over a year ago it only responded for several months and is gradually gotten worse.  She knows she is getting need to do something with this and states orthotics are helpful but not taking any of the stress off that and its not allowing her to do the activities she wants to do   ROS      Objective:  Physical Exam  Neurovascular status intact with patient's left posterior tibial tendon as it comes under the medial malleolus and inserts into the navicular found to be inflamed and painful when pressed.  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  I did discuss the probability for surgery and the type of surgery will depend on the results of MRI and that this will most likely require a period of nonweightbearing.  Patient will be seen back after MRI is completed and it is ordered today in order to ascertain the complete problem that is present  X-rays indicate that there does appear to be an accessory navicular present left

## 2021-09-01 ENCOUNTER — Telehealth: Payer: Self-pay | Admitting: Podiatry

## 2021-09-14 ENCOUNTER — Other Ambulatory Visit: Payer: Self-pay

## 2021-09-14 ENCOUNTER — Ambulatory Visit
Admission: RE | Admit: 2021-09-14 | Discharge: 2021-09-14 | Disposition: A | Discharge status: Home / Self Care | Payer: 59 | Source: Ambulatory Visit | Attending: Podiatry | Admitting: Podiatry

## 2021-09-14 DIAGNOSIS — M76822 Posterior tibial tendinitis, left leg: Secondary | ICD-10-CM

## 2021-09-14 DIAGNOSIS — T148XXA Other injury of unspecified body region, initial encounter: Secondary | ICD-10-CM

## 2021-09-15 NOTE — Progress Notes (Signed)
She should be seen if still having symptoms. No tear but appears to have tendonitis

## 2021-09-27 ENCOUNTER — Ambulatory Visit (INDEPENDENT_AMBULATORY_CARE_PROVIDER_SITE_OTHER): Payer: 59 | Admitting: Podiatry

## 2021-09-27 ENCOUNTER — Other Ambulatory Visit: Payer: Self-pay

## 2021-09-27 ENCOUNTER — Encounter: Payer: Self-pay | Admitting: Podiatry

## 2021-09-27 ENCOUNTER — Ambulatory Visit (INDEPENDENT_AMBULATORY_CARE_PROVIDER_SITE_OTHER): Payer: 59

## 2021-09-27 DIAGNOSIS — M898X9 Other specified disorders of bone, unspecified site: Secondary | ICD-10-CM

## 2021-09-27 DIAGNOSIS — M76822 Posterior tibial tendinitis, left leg: Secondary | ICD-10-CM

## 2021-09-27 NOTE — Progress Notes (Signed)
Subjective:   Patient ID: Jane Wong, female   DOB: 61 y.o.   MRN: [id number]   HPI Patient presents stating this problem is still really bothering me and its been over 2 years not had to give up so much of what I want to do.  I am interested in having something done to correct the problem   ROS      Objective:  Physical Exam  Neurovascular status intact with patient found to have continued inflammation of the insertion of the posterior tibial tendon into the navicular with enlargement around the area mild depression of the arch and swelling left over right foot in this area.  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  I reviewed condition at great length patient wants surgery and at this point I have recommended surgical intervention explaining that she will be nonweightbearing for approximate 3 to 4 weeks and total recovery.  Will take approximately 6 months to 1 year.  Patient wants surgery understands the difficulty of this is willing to accept risk and after extensive review signed consent form and is scheduled for outpatient surgery.  I did dispense air fracture walker as her other 1 is completely worn out from usage and all education given.  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

## 2021-09-30 ENCOUNTER — Telehealth: Payer: Self-pay | Admitting: Urology

## 2021-09-30 NOTE — Telephone Encounter (Signed)
DOS - 10/19/21  [facility] ADVANCED TENDON LEFT --- [id number]  [employer] EFFECTIVE DATE - 09/05/21  PLAN DEDUCTIBLE -Member's plan does not have an In-Network individual plan deductible. OUT OF POCKET - $3,500.00 W/ $2,403.46 REMAINING COINSURANCE - 20% COPAY - $0.00  PER [employer] WEBSITE FOR CPT CODE 68934 Notification or Prior Authorization is not required for the requested services  This [employer] plan does not currently require a prior authorization for these services. If you have general questions about the prior authorization requirements, please call us at [phone] or visit [email or web address] > Clinician Resources > Advance and Admission Notification Requirements. The number above acknowledges your notification. Please write this number down for future reference. Notification is not a guarantee of coverage or payment.  Decision ID #:[id number]

## 2021-10-18 MED ORDER — HYDROCODONE-ACETAMINOPHEN 10-325 MG PO TABS: 1.0000 | ORAL_TABLET | ORAL | 0 refills | Status: AC | PRN

## 2021-10-18 NOTE — Addendum Note (Signed)
Addended by: Wallene Huh on: 10/18/2021 12:58 PM   Modules accepted: Orders

## 2021-10-19 ENCOUNTER — Encounter: Payer: Self-pay | Admitting: Podiatry

## 2021-10-19 DIAGNOSIS — M65872 Other synovitis and tenosynovitis, left ankle and foot: Secondary | ICD-10-CM | POA: Diagnosis not present

## 2021-10-19 DIAGNOSIS — M25775 Osteophyte, left foot: Secondary | ICD-10-CM | POA: Diagnosis not present

## 2021-10-25 ENCOUNTER — Encounter: Payer: Self-pay | Admitting: Podiatry

## 2021-10-25 ENCOUNTER — Ambulatory Visit (INDEPENDENT_AMBULATORY_CARE_PROVIDER_SITE_OTHER): Payer: 59 | Admitting: Podiatry

## 2021-10-25 ENCOUNTER — Ambulatory Visit (INDEPENDENT_AMBULATORY_CARE_PROVIDER_SITE_OTHER): Payer: 59

## 2021-10-25 ENCOUNTER — Other Ambulatory Visit: Payer: Self-pay

## 2021-10-25 DIAGNOSIS — M25775 Osteophyte, left foot: Secondary | ICD-10-CM

## 2021-10-25 DIAGNOSIS — Z9889 Other specified postprocedural states: Secondary | ICD-10-CM | POA: Diagnosis not present

## 2021-10-26 NOTE — Progress Notes (Signed)
Subjective:   Patient ID: Jane Wong, female   DOB: 61 y.o.   MRN: [id number]   HPI Patient presents stating doing well no no fever is nonweightbearing using a rollabout and is very pleased so far with surgery   ROS      Objective:  Physical Exam  Neurovascular status intact muscle strength adequate range of motion adequate with patient's left foot doing well wound edges well coapted stitches intact minimal edema     Assessment:  Doing well post significant Kidner's procedure left     Plan:  H&P reviewed condition recommended the continuation of conservative care and continued immobilization.  Did dispense surgical shoe that she can use when she is on her rollabout and other than that no weightbearing on the foot for several more weeks  X-rays indicate we did have to take as significant portion of the navicular due to the abnormal bone formation but it did not affect the cartilage and should not be a long-term problem

## 2021-11-08 ENCOUNTER — Other Ambulatory Visit: Payer: Self-pay

## 2021-11-08 ENCOUNTER — Ambulatory Visit (INDEPENDENT_AMBULATORY_CARE_PROVIDER_SITE_OTHER): Payer: 59

## 2021-11-08 ENCOUNTER — Ambulatory Visit (INDEPENDENT_AMBULATORY_CARE_PROVIDER_SITE_OTHER): Payer: 59 | Admitting: Podiatry

## 2021-11-08 ENCOUNTER — Encounter: Payer: Self-pay | Admitting: Podiatry

## 2021-11-08 DIAGNOSIS — Z9889 Other specified postprocedural states: Secondary | ICD-10-CM | POA: Diagnosis not present

## 2021-11-09 NOTE — Progress Notes (Signed)
Subjective:  ? ?Patient ID: Jane Wong, female   DOB: 61 y.o.   MRN: [id number]  ? ?HPI ?Patient presents stating that she is doing very well still been completely nonweightbearing ? ? ?ROS ? ? ?   ?Objective:  ?Physical Exam  ?Neurovascular status intact negative Bevelyn Buckles' sign noted wound edges left healed very well stitches intact minimal swelling doing ? ?   ?Assessment:  ?Well post Kidner procedure left ? ?   ?Plan:  ?Stitches are removed wound edges healed well compression reapplied slow beginning of weightbearing with boot and not going without the boot and then in approximate 2 weeks can slowly go to surgical shoe and hopefully in 4 weeks back to tennis shoe ? ?X-rays indicate satisfactory resection of spur the joint looks good ?   ? ? ?

## 2021-12-13 ENCOUNTER — Encounter: Payer: Self-pay | Admitting: Podiatry

## 2021-12-13 ENCOUNTER — Ambulatory Visit (INDEPENDENT_AMBULATORY_CARE_PROVIDER_SITE_OTHER): Payer: 59 | Admitting: Podiatry

## 2021-12-13 ENCOUNTER — Ambulatory Visit (INDEPENDENT_AMBULATORY_CARE_PROVIDER_SITE_OTHER): Payer: 59

## 2021-12-13 DIAGNOSIS — Z9889 Other specified postprocedural states: Secondary | ICD-10-CM | POA: Diagnosis not present

## 2021-12-14 NOTE — Progress Notes (Signed)
Subjective:  ? ?Patient ID: Jane Wong, female   DOB: 61 y.o.   MRN: [id number]  ? ?HPI ?Patient presents stating she is doing well and is starting to wear shoe gear ? ? ?ROS ? ? ?   ?Objective:  ?Physical Exam  ?Neurovascular status intact incision site left arch healing well wound edges well coapted well ? ?   ?Assessment:  ?Doing well post Kidner type procedure left ? ?   ?Plan:  ?Advised this patient on return to activity slowly over the next few weeks with hopeful return to tennis in the next 4 to 6 weeks.  I want her to start a very slow though and build that up and hopefully in 8 to 12 weeks can resume all normal activity ? ?X-rays indicate satisfactory resection of bone good alignment no pathology ?   ? ? ?

## 2022-03-10 ENCOUNTER — Emergency Department (HOSPITAL_COMMUNITY): Payer: 59 | Admitting: Certified Registered"

## 2022-03-10 ENCOUNTER — Encounter (HOSPITAL_COMMUNITY): Admission: EM | Disposition: A | Discharge status: Home / Self Care | Payer: Self-pay | Source: Home / Self Care

## 2022-03-10 ENCOUNTER — Encounter (HOSPITAL_COMMUNITY): Payer: Self-pay

## 2022-03-10 ENCOUNTER — Inpatient Hospital Stay (HOSPITAL_COMMUNITY)
Admission: EM | Admit: 2022-03-10 | Discharge: 2022-03-13 | DRG: 339 | Disposition: A | Discharge status: Home / Self Care | Payer: 59 | Attending: Surgery | Admitting: Surgery

## 2022-03-10 ENCOUNTER — Emergency Department (HOSPITAL_COMMUNITY): Payer: 59

## 2022-03-10 ENCOUNTER — Other Ambulatory Visit: Payer: Self-pay

## 2022-03-10 DIAGNOSIS — K3532 Acute appendicitis with perforation and localized peritonitis, without abscess: Secondary | ICD-10-CM

## 2022-03-10 DIAGNOSIS — Z880 Allergy status to penicillin: Secondary | ICD-10-CM

## 2022-03-10 DIAGNOSIS — K37 Unspecified appendicitis: Secondary | ICD-10-CM | POA: Diagnosis present

## 2022-03-10 DIAGNOSIS — E860 Dehydration: Secondary | ICD-10-CM | POA: Diagnosis present

## 2022-03-10 DIAGNOSIS — K353 Acute appendicitis with localized peritonitis, without perforation or gangrene: Principal | ICD-10-CM

## 2022-03-10 DIAGNOSIS — I10 Essential (primary) hypertension: Secondary | ICD-10-CM | POA: Diagnosis present

## 2022-03-10 DIAGNOSIS — K567 Ileus, unspecified: Secondary | ICD-10-CM | POA: Diagnosis not present

## 2022-03-10 DIAGNOSIS — N179 Acute kidney failure, unspecified: Secondary | ICD-10-CM | POA: Diagnosis present

## 2022-03-10 DIAGNOSIS — Z79899 Other long term (current) drug therapy: Secondary | ICD-10-CM | POA: Diagnosis not present

## 2022-03-10 DIAGNOSIS — K219 Gastro-esophageal reflux disease without esophagitis: Secondary | ICD-10-CM | POA: Diagnosis present

## 2022-03-10 DIAGNOSIS — D72829 Elevated white blood cell count, unspecified: Secondary | ICD-10-CM

## 2022-03-10 HISTORY — PX: LAPAROSCOPIC APPENDECTOMY: SHX408

## 2022-03-10 LAB — CBC WITH DIFFERENTIAL/PLATELET
Abs Immature Granulocytes: 0.21 10*3/uL — ABNORMAL HIGH (ref 0.00–0.07)
Basophils Absolute: 0.1 10*3/uL (ref 0.0–0.1)
Basophils Relative: 0 %
Eosinophils Absolute: 0 10*3/uL (ref 0.0–0.5)
Eosinophils Relative: 0 %
HCT: 38.4 % (ref 36.0–46.0)
Hemoglobin: 12.9 g/dL (ref 12.0–15.0)
Immature Granulocytes: 1 %
Lymphocytes Relative: 7 %
Lymphs Abs: 1.2 10*3/uL (ref 0.7–4.0)
MCH: 29.9 pg (ref 26.0–34.0)
MCHC: 33.6 g/dL (ref 30.0–36.0)
MCV: 89.1 fL (ref 80.0–100.0)
Monocytes Absolute: 0.9 10*3/uL (ref 0.1–1.0)
Monocytes Relative: 5 %
Neutro Abs: 15.9 10*3/uL — ABNORMAL HIGH (ref 1.7–7.7)
Neutrophils Relative %: 87 %
Platelets: 360 10*3/uL (ref 150–400)
RBC: 4.31 MIL/uL (ref 3.87–5.11)
RDW: 11.9 % (ref 11.5–15.5)
WBC: 18.3 10*3/uL — ABNORMAL HIGH (ref 4.0–10.5)
nRBC: 0 % (ref 0.0–0.2)

## 2022-03-10 LAB — COMPREHENSIVE METABOLIC PANEL
ALT: 13 U/L (ref 0–44)
AST: 14 U/L — ABNORMAL LOW (ref 15–41)
Albumin: 3.9 g/dL (ref 3.5–5.0)
Alkaline Phosphatase: 90 U/L (ref 38–126)
Anion gap: 15 (ref 5–15)
BUN: 34 mg/dL — ABNORMAL HIGH (ref 6–20)
CO2: 22 mmol/L (ref 22–32)
Calcium: 9 mg/dL (ref 8.9–10.3)
Chloride: 99 mmol/L (ref 98–111)
Creatinine, Ser: 2.04 mg/dL — ABNORMAL HIGH (ref 0.44–1.00)
GFR, Estimated: 28 mL/min — ABNORMAL LOW (ref >60)
Glucose, Bld: 114 mg/dL — ABNORMAL HIGH (ref 70–99)
Potassium: 3.8 mmol/L (ref 3.5–5.1)
Sodium: 136 mmol/L (ref 135–145)
Total Bilirubin: 1 mg/dL (ref 0.3–1.2)
Total Protein: 7.9 g/dL (ref 6.5–8.1)

## 2022-03-10 LAB — LIPASE, BLOOD: Lipase: 29 U/L (ref 11–51)

## 2022-03-10 SURGERY — APPENDECTOMY, LAPAROSCOPIC
Anesthesia: General

## 2022-03-10 MED ORDER — SUGAMMADEX SODIUM 500 MG/5ML IV SOLN
INTRAVENOUS | Status: DC | PRN
  Administered 2022-03-10: 400 mg via INTRAVENOUS

## 2022-03-10 MED ORDER — LACTATED RINGERS IV BOLUS
1000.0000 mL | Freq: Once | INTRAVENOUS | Status: AC
  Administered 2022-03-10: 1000 mL via INTRAVENOUS

## 2022-03-10 MED ORDER — LIDOCAINE 2% (20 MG/ML) 5 ML SYRINGE
INTRAMUSCULAR | Status: DC | PRN
  Administered 2022-03-10: 40 mg via INTRAVENOUS
  Administered 2022-03-10: 60 mg via INTRAVENOUS

## 2022-03-10 MED ORDER — LACTATED RINGERS IR SOLN
Status: DC | PRN
  Administered 2022-03-10: 2000 mL

## 2022-03-10 MED ORDER — SUCCINYLCHOLINE CHLORIDE 200 MG/10ML IV SOSY
PREFILLED_SYRINGE | INTRAVENOUS | Status: AC
  Filled 2022-03-10: qty 30

## 2022-03-10 MED ORDER — ACETAMINOPHEN 500 MG PO TABS
1000.0000 mg | ORAL_TABLET | Freq: Four times a day (QID) | ORAL | Status: DC
Start: 2022-03-11 — End: 2022-03-13
  Administered 2022-03-11 – 2022-03-13 (×10): 1000 mg via ORAL
  Filled 2022-03-10 (×10): qty 2

## 2022-03-10 MED ORDER — ONDANSETRON HCL 4 MG/2ML IJ SOLN: 4.0000 mg | Freq: Four times a day (QID) | INTRAMUSCULAR | Status: DC | PRN

## 2022-03-10 MED ORDER — ONDANSETRON HCL 4 MG/2ML IJ SOLN
INTRAMUSCULAR | Status: DC | PRN
  Administered 2022-03-10: 4 mg via INTRAVENOUS

## 2022-03-10 MED ORDER — OXYCODONE HCL 5 MG PO TABS: 5.0000 mg | ORAL_TABLET | Freq: Once | ORAL | Status: DC | PRN

## 2022-03-10 MED ORDER — SUGAMMADEX SODIUM 500 MG/5ML IV SOLN
INTRAVENOUS | Status: AC
  Filled 2022-03-10: qty 5

## 2022-03-10 MED ORDER — EPHEDRINE 5 MG/ML INJ
INTRAVENOUS | Status: AC
Start: 2022-03-10 — End: ?
  Filled 2022-03-10: qty 10

## 2022-03-10 MED ORDER — FENTANYL CITRATE PF 50 MCG/ML IJ SOSY
50.0000 ug | PREFILLED_SYRINGE | Freq: Once | INTRAMUSCULAR | Status: AC
  Administered 2022-03-10: 50 ug via INTRAVENOUS
  Filled 2022-03-10: qty 1

## 2022-03-10 MED ORDER — FENTANYL CITRATE PF 50 MCG/ML IJ SOSY
25.0000 ug | PREFILLED_SYRINGE | INTRAMUSCULAR | Status: DC | PRN
  Administered 2022-03-10: 25 ug via INTRAVENOUS

## 2022-03-10 MED ORDER — EPHEDRINE SULFATE-NACL 50-0.9 MG/10ML-% IV SOSY
PREFILLED_SYRINGE | INTRAVENOUS | Status: DC | PRN
  Administered 2022-03-10: 5 mg via INTRAVENOUS
  Administered 2022-03-10: 10 mg via INTRAVENOUS

## 2022-03-10 MED ORDER — SODIUM CHLORIDE 0.9 % IV SOLN: 2.0000 g | Freq: Three times a day (TID) | INTRAVENOUS | Status: DC

## 2022-03-10 MED ORDER — HYDROMORPHONE HCL 1 MG/ML IJ SOLN: 1.0000 mg | INTRAMUSCULAR | Status: DC | PRN

## 2022-03-10 MED ORDER — DIPHENHYDRAMINE HCL 50 MG/ML IJ SOLN: 25.0000 mg | Freq: Four times a day (QID) | INTRAMUSCULAR | Status: DC | PRN

## 2022-03-10 MED ORDER — PHENYLEPHRINE 80 MCG/ML (10ML) SYRINGE FOR IV PUSH (FOR BLOOD PRESSURE SUPPORT)
PREFILLED_SYRINGE | INTRAVENOUS | Status: AC
  Filled 2022-03-10: qty 40

## 2022-03-10 MED ORDER — SUCCINYLCHOLINE CHLORIDE 200 MG/10ML IV SOSY
PREFILLED_SYRINGE | INTRAVENOUS | Status: DC | PRN
  Administered 2022-03-10: 120 mg via INTRAVENOUS

## 2022-03-10 MED ORDER — ROCURONIUM BROMIDE 10 MG/ML (PF) SYRINGE
PREFILLED_SYRINGE | INTRAVENOUS | Status: DC | PRN
  Administered 2022-03-10: 60 mg via INTRAVENOUS

## 2022-03-10 MED ORDER — METRONIDAZOLE 500 MG/100ML IV SOLN: 500.0000 mg | Freq: Two times a day (BID) | INTRAVENOUS | Status: DC

## 2022-03-10 MED ORDER — PROPOFOL 10 MG/ML IV BOLUS
INTRAVENOUS | Status: DC | PRN
  Administered 2022-03-10: 140 mg via INTRAVENOUS

## 2022-03-10 MED ORDER — FENTANYL CITRATE (PF) 250 MCG/5ML IJ SOLN
INTRAMUSCULAR | Status: DC | PRN
  Administered 2022-03-10 (×2): 50 ug via INTRAVENOUS
  Administered 2022-03-10: 100 ug via INTRAVENOUS
  Administered 2022-03-10: 50 ug via INTRAVENOUS

## 2022-03-10 MED ORDER — PHENYLEPHRINE HCL-NACL 20-0.9 MG/250ML-% IV SOLN
INTRAVENOUS | Status: DC | PRN
  Administered 2022-03-10: 25 ug/min via INTRAVENOUS

## 2022-03-10 MED ORDER — ENOXAPARIN SODIUM 40 MG/0.4ML IJ SOSY
40.0000 mg | PREFILLED_SYRINGE | INTRAMUSCULAR | Status: DC
  Administered 2022-03-11 – 2022-03-13 (×3): 40 mg via SUBCUTANEOUS
  Filled 2022-03-10 (×3): qty 0.4

## 2022-03-10 MED ORDER — ONDANSETRON HCL 4 MG/2ML IJ SOLN
4.0000 mg | Freq: Once | INTRAMUSCULAR | Status: AC | PRN
  Administered 2022-03-10: 4 mg via INTRAVENOUS
  Filled 2022-03-10: qty 2

## 2022-03-10 MED ORDER — METRONIDAZOLE 500 MG/100ML IV SOLN
500.0000 mg | Freq: Once | INTRAVENOUS | Status: AC
Start: 2022-03-10 — End: 2022-03-10
  Administered 2022-03-10: 500 mg via INTRAVENOUS
  Filled 2022-03-10: qty 100

## 2022-03-10 MED ORDER — DIPHENHYDRAMINE HCL 25 MG PO CAPS
25.0000 mg | ORAL_CAPSULE | Freq: Four times a day (QID) | ORAL | Status: DC | PRN
  Administered 2022-03-11 – 2022-03-12 (×2): 25 mg via ORAL
  Filled 2022-03-10 (×2): qty 1

## 2022-03-10 MED ORDER — POTASSIUM CHLORIDE IN NACL 20-0.9 MEQ/L-% IV SOLN
INTRAVENOUS | Status: DC
  Filled 2022-03-10 (×5): qty 1000

## 2022-03-10 MED ORDER — LACTATED RINGERS IV SOLN: INTRAVENOUS | Status: DC | PRN

## 2022-03-10 MED ORDER — OXYCODONE HCL 5 MG PO TABS
5.0000 mg | ORAL_TABLET | ORAL | Status: DC | PRN
  Administered 2022-03-11: 10 mg via ORAL
  Filled 2022-03-10 (×2): qty 1

## 2022-03-10 MED ORDER — FENTANYL CITRATE PF 50 MCG/ML IJ SOSY
PREFILLED_SYRINGE | INTRAMUSCULAR | Status: AC
  Administered 2022-03-10: 25 ug via INTRAVENOUS
  Filled 2022-03-10: qty 1

## 2022-03-10 MED ORDER — METRONIDAZOLE 500 MG/100ML IV SOLN
500.0000 mg | Freq: Two times a day (BID) | INTRAVENOUS | Status: DC
  Administered 2022-03-11 – 2022-03-13 (×5): 500 mg via INTRAVENOUS
  Filled 2022-03-10 (×5): qty 100

## 2022-03-10 MED ORDER — LIDOCAINE HCL (PF) 2 % IJ SOLN
INTRAMUSCULAR | Status: AC
  Filled 2022-03-10: qty 15

## 2022-03-10 MED ORDER — OXYCODONE HCL 5 MG/5ML PO SOLN: 5.0000 mg | Freq: Once | ORAL | Status: DC | PRN

## 2022-03-10 MED ORDER — PHENYLEPHRINE HCL (PRESSORS) 10 MG/ML IV SOLN
INTRAVENOUS | Status: AC
  Filled 2022-03-10: qty 1

## 2022-03-10 MED ORDER — SODIUM CHLORIDE 0.9 % IV SOLN
2.0000 g | Freq: Two times a day (BID) | INTRAVENOUS | Status: DC
  Administered 2022-03-11 – 2022-03-13 (×5): 2 g via INTRAVENOUS
  Filled 2022-03-10 (×6): qty 12.5

## 2022-03-10 MED ORDER — BUPIVACAINE HCL (PF) 0.5 % IJ SOLN
INTRAMUSCULAR | Status: DC | PRN
  Administered 2022-03-10: 20 mL

## 2022-03-10 MED ORDER — ONDANSETRON 4 MG PO TBDP: 4.0000 mg | ORAL_TABLET | Freq: Four times a day (QID) | ORAL | Status: DC | PRN

## 2022-03-10 MED ORDER — MIDAZOLAM HCL 2 MG/2ML IJ SOLN
INTRAMUSCULAR | Status: AC
  Filled 2022-03-10: qty 2

## 2022-03-10 MED ORDER — MIDAZOLAM HCL 2 MG/2ML IJ SOLN
INTRAMUSCULAR | Status: DC | PRN
  Administered 2022-03-10 (×2): 1 mg via INTRAVENOUS

## 2022-03-10 MED ORDER — FENTANYL CITRATE (PF) 250 MCG/5ML IJ SOLN
INTRAMUSCULAR | Status: AC
  Filled 2022-03-10: qty 5

## 2022-03-10 MED ORDER — METOCLOPRAMIDE HCL 5 MG/ML IJ SOLN
5.0000 mg | Freq: Once | INTRAMUSCULAR | Status: AC
  Administered 2022-03-10: 5 mg via INTRAVENOUS
  Filled 2022-03-10: qty 2

## 2022-03-10 MED ORDER — PROPOFOL 10 MG/ML IV BOLUS
INTRAVENOUS | Status: AC
  Filled 2022-03-10: qty 20

## 2022-03-10 MED ORDER — CEFEPIME HCL 2 G IV SOLR
2.0000 g | Freq: Once | INTRAVENOUS | Status: AC
Start: 2022-03-10 — End: 2022-03-10
  Administered 2022-03-10: 2 g via INTRAVENOUS
  Filled 2022-03-10: qty 12.5

## 2022-03-10 MED ORDER — ROCURONIUM BROMIDE 10 MG/ML (PF) SYRINGE
PREFILLED_SYRINGE | INTRAVENOUS | Status: AC
  Filled 2022-03-10: qty 10

## 2022-03-10 MED ORDER — TRAMADOL HCL 50 MG PO TABS: 50.0000 mg | ORAL_TABLET | Freq: Four times a day (QID) | ORAL | Status: DC | PRN

## 2022-03-10 MED ORDER — DEXAMETHASONE SODIUM PHOSPHATE 10 MG/ML IJ SOLN
INTRAMUSCULAR | Status: DC | PRN
  Administered 2022-03-10: 10 mg via INTRAVENOUS

## 2022-03-10 SURGICAL SUPPLY — 37 items
APPLIER CLIP 5 13 M/L LIGAMAX5 (MISCELLANEOUS)
BAG COUNTER SPONGE SURGICOUNT (BAG) IMPLANT
CHLORAPREP W/TINT 26 (MISCELLANEOUS) ×2 IMPLANT
CLIP APPLIE 5 13 M/L LIGAMAX5 (MISCELLANEOUS) IMPLANT
COVER SURGICAL LIGHT HANDLE (MISCELLANEOUS) ×2 IMPLANT
CUTTER FLEX LINEAR 45M (STAPLE) ×1 IMPLANT
DERMABOND ADVANCED (GAUZE/BANDAGES/DRESSINGS) ×1
DERMABOND ADVANCED .7 DNX12 (GAUZE/BANDAGES/DRESSINGS) ×1 IMPLANT
DRAPE LAPAROSCOPIC ABDOMINAL (DRAPES) ×2 IMPLANT
ELECT COAG MONOPOLAR (ELECTROSURGICAL) ×2
ELECT REM PT RETURN 15FT ADLT (MISCELLANEOUS) ×2 IMPLANT
ELECTRODE COAG MONOPOLAR (ELECTROSURGICAL) ×1 IMPLANT
GLOVE BIO SURGEON STRL SZ7.5 (GLOVE) ×5 IMPLANT
GOWN STRL REUS W/ TWL XL LVL3 (GOWN DISPOSABLE) ×2 IMPLANT
GOWN STRL REUS W/TWL XL LVL3 (GOWN DISPOSABLE) ×4
IRRIG SUCT STRYKERFLOW 2 WTIP (MISCELLANEOUS) ×2
IRRIGATION SUCT STRKRFLW 2 WTP (MISCELLANEOUS) ×1 IMPLANT
KIT BASIN OR (CUSTOM PROCEDURE TRAY) ×2 IMPLANT
KIT TURNOVER KIT A (KITS) ×1 IMPLANT
PENCIL SMOKE EVACUATOR (MISCELLANEOUS) IMPLANT
RELOAD 45 VASCULAR/THIN (ENDOMECHANICALS) IMPLANT
RELOAD STAPLE 45 2.5 WHT GRN (ENDOMECHANICALS) IMPLANT
RELOAD STAPLE 45 3.5 BLU ETS (ENDOMECHANICALS) IMPLANT
RELOAD STAPLE TA45 3.5 REG BLU (ENDOMECHANICALS) ×2 IMPLANT
SET TUBE SMOKE EVAC HIGH FLOW (TUBING) ×2 IMPLANT
SHEARS HARMONIC ACE PLUS 36CM (ENDOMECHANICALS) ×1 IMPLANT
SLEEVE Z-THREAD 5X100MM (TROCAR) ×2 IMPLANT
SPIKE FLUID TRANSFER (MISCELLANEOUS) ×1 IMPLANT
SUT MNCRL AB 4-0 PS2 18 (SUTURE) ×2 IMPLANT
SYS BAG RETRIEVAL 10MM (BASKET) ×2
SYSTEM BAG RETRIEVAL 10MM (BASKET) ×1 IMPLANT
TOWEL OR 17X26 10 PK STRL BLUE (TOWEL DISPOSABLE) ×2 IMPLANT
TOWEL OR NON WOVEN STRL DISP B (DISPOSABLE) ×2 IMPLANT
TRAY FOLEY MTR SLVR 16FR STAT (SET/KITS/TRAYS/PACK) ×1 IMPLANT
TRAY LAPAROSCOPIC (CUSTOM PROCEDURE TRAY) ×2 IMPLANT
TROCAR BALLN 12MMX100 BLUNT (TROCAR) ×2 IMPLANT
TROCAR Z-THREAD OPTICAL 5X100M (TROCAR) ×2 IMPLANT

## 2022-03-10 NOTE — Anesthesia Procedure Notes (Signed)
Date/Time: 03/10/2022 9:54 PM  Performed by: Cynda Familia, CRNAOxygen Delivery Method: Simple face mask Placement Confirmation: positive ETCO2 and breath sounds checked- equal and bilateral Dental Injury: Teeth and Oropharynx as per pre-operative assessment

## 2022-03-10 NOTE — Anesthesia Postprocedure Evaluation (Signed)
Anesthesia Post Note  Patient: Jane Wong  Procedure(s) Performed: APPENDECTOMY LAPAROSCOPIC     Patient location during evaluation: PACU Anesthesia Type: General Level of consciousness: awake and alert Pain management: pain level controlled Vital Signs Assessment: post-procedure vital signs reviewed and stable Respiratory status: spontaneous breathing, nonlabored ventilation, respiratory function stable and patient connected to nasal cannula oxygen Cardiovascular status: blood pressure returned to baseline and stable Postop Assessment: no apparent nausea or vomiting Anesthetic complications: no   No notable events documented.  Last Vitals:  Vitals:   03/10/22 2215 03/10/22 2219  BP: 111/65   Pulse: 90 88  Resp: 20 18  Temp:    SpO2: 95% 96%    Last Pain:  Vitals:   03/10/22 2215  TempSrc:   PainSc: 5                  Everardo Voris S

## 2022-03-10 NOTE — Anesthesia Preprocedure Evaluation (Signed)
Anesthesia Evaluation  Patient identified by MRN, date of birth, ID band Patient awake    Reviewed: Allergy & Precautions, H&P , NPO status , Patient's Chart, lab work & pertinent test results  Airway Mallampati: II   Neck ROM: full    Dental   Pulmonary neg pulmonary ROS,    breath sounds clear to auscultation       Cardiovascular hypertension,  Rhythm:regular Rate:Normal     Neuro/Psych    GI/Hepatic appendicitis   Endo/Other    Renal/GU ARFRenal disease     Musculoskeletal  (+) Arthritis ,   Abdominal   Peds  Hematology   Anesthesia Other Findings   Reproductive/Obstetrics                             Anesthesia Physical Anesthesia Plan  ASA: 2 and emergent  Anesthesia Plan: General   Post-op Pain Management:    Induction: Intravenous  PONV Risk Score and Plan: 3 and Ondansetron, Dexamethasone, Midazolam and Treatment may vary due to age or medical condition  Airway Management Planned: Oral ETT  Additional Equipment:   Intra-op Plan:   Post-operative Plan: Extubation in OR  Informed Consent: I have reviewed the patients History and Physical, chart, labs and discussed the procedure including the risks, benefits and alternatives for the proposed anesthesia with the patient or authorized representative who has indicated his/her understanding and acceptance.     Dental advisory given  Plan Discussed with: CRNA, Anesthesiologist and Surgeon  Anesthesia Plan Comments:         Anesthesia Quick Evaluation

## 2022-03-10 NOTE — Progress Notes (Signed)
PHARMACY NOTE:  ANTIMICROBIAL RENAL DOSAGE ADJUSTMENT  Current antimicrobial regimen includes a mismatch between antimicrobial dosage and estimated renal function.  As per policy approved by the Pharmacy & Therapeutics and Medical Executive Committees, the antimicrobial dosage will be adjusted accordingly.  Current antimicrobial dosage:  Cefepime 2gm IV q8h x 5 days  Indication: intra-abdominal infection  Renal Function:  Estimated Creatinine Clearance: 33.5 mL/min (A) (by C-G formula based on SCr of 2.04 mg/dL (H)).       Antimicrobial dosage has been changed to:  Cefepime 2gm IV q12h x 5 days  Thank you for allowing pharmacy to be a part of this patient's care.  Everette Rank, [facility] 03/10/2022 11:22 PM

## 2022-03-10 NOTE — Anesthesia Procedure Notes (Signed)
Procedure Name: Intubation Date/Time: 03/10/2022 9:08 PM  Performed by: Cynda Familia, CRNAPre-anesthesia Checklist: Patient identified, Emergency Drugs available, Suction available and Patient being monitored Patient Re-evaluated:Patient Re-evaluated prior to induction Oxygen Delivery Method: Circle System Utilized Preoxygenation: Pre-oxygenation with 100% oxygen Induction Type: IV induction, Rapid sequence and Cricoid Pressure applied Laryngoscope Size: Miller and 2 Grade View: Grade II Tube type: Oral Number of attempts: 1 Airway Equipment and Method: Stylet Placement Confirmation: ETT inserted through vocal cords under direct vision, positive ETCO2 and breath sounds checked- equal and bilateral Secured at: 22 cm Tube secured with: Tape Dental Injury: Teeth and Oropharynx as per pre-operative assessment  Comments: Smooth IV induction Hodierne-- intubation AM CRNA atraumatic-- teeth and mouth as preop -- bilat BS

## 2022-03-10 NOTE — H&P (Signed)
CC: abdominal pain  HPI: Jane Wong is an 61 y.o. female who presents with a 3-day history of lower abdominal pain.  She reports he also had some pain in her chest.  She has felt lightheaded as well.  She has had nausea but no vomiting but has had diarrhea.  She presented to the emergency department she underwent a CT scan of the abdomen and pelvis showing findings consistent with appendicitis.  She has had a previous cholecystectomy.  She has no cardiopulmonary issues.  The pain is moderate and described as sharp in the right and left lower quadrants but mostly on the right.  She denies fevers or chills.  She denies dysuria  Past Medical History:  Diagnosis Date   Hypertension     Past Surgical History:  Procedure Laterality Date   REDUCTION MAMMAPLASTY Bilateral   Cholecystectomy Foot surgery  Family History  Problem Relation Age of Onset   Breast cancer Neg Hx     Social:  reports that she has never smoked. She has never used smokeless tobacco. She reports current drug use. She reports that she does not drink alcohol.  Allergies:  Allergies  Allergen Reactions   Penicillins     Medications: I have reviewed the patient's current medications.  Results for orders placed or performed during the hospital encounter of 03/10/22 (from the past 48 hour(s))  CBC with Differential     Status: Abnormal   Collection Time: 03/10/22  5:25 PM  Result Value Ref Range   WBC 18.3 (H) 4.0 - 10.5 K/uL   RBC 4.31 3.87 - 5.11 MIL/uL   Hemoglobin 12.9 12.0 - 15.0 g/dL   HCT 38.4 36.0 - 46.0 %   MCV 89.1 80.0 - 100.0 fL   MCH 29.9 26.0 - 34.0 pg   MCHC 33.6 30.0 - 36.0 g/dL   RDW 11.9 11.5 - 15.5 %   Platelets 360 150 - 400 K/uL   nRBC 0.0 0.0 - 0.2 %   Neutrophils Relative % 87 %   Neutro Abs 15.9 (H) 1.7 - 7.7 K/uL   Lymphocytes Relative 7 %   Lymphs Abs 1.2 0.7 - 4.0 K/uL   Monocytes Relative 5 %   Monocytes Absolute 0.9 0.1 - 1.0 K/uL   Eosinophils Relative 0 %   Eosinophils  Absolute 0.0 0.0 - 0.5 K/uL   Basophils Relative 0 %   Basophils Absolute 0.1 0.0 - 0.1 K/uL   Immature Granulocytes 1 %   Abs Immature Granulocytes 0.21 (H) 0.00 - 0.07 K/uL    Comment: Performed at [facility], [place] [address] [place], [place] [address]  Comprehensive metabolic panel     Status: Abnormal   Collection Time: 03/10/22  5:25 PM  Result Value Ref Range   Sodium 136 135 - 145 mmol/L   Potassium 3.8 3.5 - 5.1 mmol/L   Chloride 99 98 - 111 mmol/L   CO2 22 22 - 32 mmol/L   Glucose, Bld 114 (H) 70 - 99 mg/dL    Comment: Glucose reference range applies only to samples taken after fasting for at least 8 hours.   BUN 34 (H) 6 - 20 mg/dL   Creatinine, Ser 2.04 (H) 0.44 - 1.00 mg/dL   Calcium 9.0 8.9 - 10.3 mg/dL   Total Protein 7.9 6.5 - 8.1 g/dL   Albumin 3.9 3.5 - 5.0 g/dL   AST 14 (L) 15 - 41 U/L   ALT 13 0 - 44 U/L  Alkaline Phosphatase 90 38 - 126 U/L   Total Bilirubin 1.0 0.3 - 1.2 mg/dL   GFR, Estimated 28 (L) >60 mL/min    Comment: (NOTE) Calculated using the CKD-EPI Creatinine Equation (2021)    Anion gap 15 5 - 15    Comment: Performed at [facility], [place] [address], [place], [place] [address]  Lipase, blood     Status: None   Collection Time: 03/10/22  5:25 PM  Result Value Ref Range   Lipase 29 11 - 51 U/L    Comment: Performed at [facility], [place] [address] [place], [place] [address]    CT ABDOMEN PELVIS WO CONTRAST  Result Date: 03/10/2022 CLINICAL DATA:  Acute abdominal pain. Bilateral lower quadrant pain. EXAM: CT ABDOMEN AND PELVIS WITHOUT CONTRAST TECHNIQUE: Multidetector CT imaging of the abdomen and pelvis was performed following the standard protocol without IV contrast. RADIATION DOSE REDUCTION: This exam was performed according to the departmental dose-optimization program which includes automated exposure control, adjustment of the mA and/or kV according to patient size and/or use of  iterative reconstruction technique. COMPARISON:  None Available. FINDINGS: Lower chest: Subsegmental atelectasis in the lingula, right middle and anterior left lower lobe. No consolidation or pleural effusion. The heart is normal in size. Hepatobiliary: No focal hepatic abnormality. Left lobe of the liver is slightly enlarged. Clips in the gallbladder fossa postcholecystectomy. No biliary dilatation. Pancreas: No ductal dilatation or inflammation. Spleen: Normal in size without focal abnormality. Adrenals/Urinary Tract: Normal adrenal glands. No hydronephrosis or renal calculi. No perinephric edema. Ureters are decompressed. The urinary bladder is completely empty. There is mild bladder wall thickening and adjacent fat stranding. Stomach/Bowel: Findings consistent with acute appendicitis. The appendix is dilated, fluid-filled with periappendiceal fat stranding. There is an appendicolith at the base of the appendix. The appendiceal wall is poorly defined raising concern for early perforation. Peri appendiceal as well as generalized fat stranding in the right lower quadrant and pelvis. Small amount of free fluid in the right pericolic gutter. No free air or drainable abscess. Mild high density in the cecum is likely ingested material. Colon is fluid-filled with air-fluid levels. Sigmoid colonic diverticulosis. Fat stranding about the mid sigmoid in the region of multiple diverticula, favored to be reactive related to appendicitis. Small bowel is mildly fluid-filled but nondilated, no obstruction. Mild wall thickening about the distal and terminal ileum, felt to be reactive. Tiny hiatal hernia. Vascular/Lymphatic: Mild aortic atherosclerosis. No aortic aneurysm. Scattered small mesenteric and retroperitoneal nodes, typically reactive. This includes prominent ileocolic nodes. All lymph nodes are subcentimeter short axis. Reproductive: The uterus is retroverted.  No adnexal mass. Other: Prominent fat stranding in the  pelvis as well as right lower quadrant. Small amount of non organized free fluid in the right pericolic gutter tracking into the pelvis. No organized or drainable collection. No free air. No abdominal wall hernia. Musculoskeletal: Lower lumbar facet hypertrophy. L5-S1 degenerative disc disease. There are no acute or suspicious osseous abnormalities. IMPRESSION: 1. Acute appendicitis with an appendicolith at the base of the appendix. The appendiceal wall is poorly defined raising concern for early or impending perforation. No free air or drainable abscess. 2. Sigmoid colonic diverticulosis. Fat stranding about the mid sigmoid in the region of multiple diverticula, favored to be reactive related to appendicitis rather than concurrent diverticulitis. 3. Mild bladder wall thickening and adjacent fat stranding. This likely is secondary to adjacent bowel inflammation, although recommend correlation with urinalysis to exclude urinary tract infection. Aortic  Atherosclerosis (ICD10-I70.0). Electronically Signed   By: Keith Rake M.D.   On: 03/10/2022 18:33    ROS - all of the below systems have been reviewed with the patient and positives are indicated with bold text General: chills, fever or night sweats Eyes: blurry vision or double vision ENT: epistaxis or sore throat Allergy/Immunology: itchy/watery eyes or nasal congestion Hematologic/Lymphatic: bleeding problems, blood clots or swollen lymph nodes Endocrine: temperature intolerance or unexpected weight changes Breast: new or changing breast lumps or nipple discharge Resp: cough, shortness of breath, or wheezing CV: chest pain or dyspnea on exertion GI: as per HPI GU: dysuria, trouble voiding, or hematuria MSK: joint pain or joint stiffness Neuro: TIA or stroke symptoms Derm: pruritus and skin lesion changes Psych: anxiety and depression  PE Blood pressure 101/65, pulse 76, temperature 98.6 F (37 C), temperature source Oral, resp. rate 17,  height '5\' 4"'$  (1.626 m), weight 96.6 kg, last menstrual period 01/10/2018, SpO2 96 %. Constitutional: NAD; conversant; no deformities Eyes: Moist conjunctiva; no lid lag; anicteric; PERRL Neck: Trachea midline; no thyromegaly Lungs: Normal respiratory effort; no tactile fremitus CV: RRR; no palpable thrills; no pitting edema GI: Abd soft and nondistended but tenderness with guarding mostly in the right lower quadrant; no palpable hepatosplenomegaly MSK: Normal range of motion of extremities; no clubbing/cyanosis Psychiatric: Appropriate affect; alert and oriented x3   Results for orders placed or performed during the hospital encounter of 03/10/22 (from the past 48 hour(s))  CBC with Differential     Status: Abnormal   Collection Time: 03/10/22  5:25 PM  Result Value Ref Range   WBC 18.3 (H) 4.0 - 10.5 K/uL   RBC 4.31 3.87 - 5.11 MIL/uL   Hemoglobin 12.9 12.0 - 15.0 g/dL   HCT 38.4 36.0 - 46.0 %   MCV 89.1 80.0 - 100.0 fL   MCH 29.9 26.0 - 34.0 pg   MCHC 33.6 30.0 - 36.0 g/dL   RDW 11.9 11.5 - 15.5 %   Platelets 360 150 - 400 K/uL   nRBC 0.0 0.0 - 0.2 %   Neutrophils Relative % 87 %   Neutro Abs 15.9 (H) 1.7 - 7.7 K/uL   Lymphocytes Relative 7 %   Lymphs Abs 1.2 0.7 - 4.0 K/uL   Monocytes Relative 5 %   Monocytes Absolute 0.9 0.1 - 1.0 K/uL   Eosinophils Relative 0 %   Eosinophils Absolute 0.0 0.0 - 0.5 K/uL   Basophils Relative 0 %   Basophils Absolute 0.1 0.0 - 0.1 K/uL   Immature Granulocytes 1 %   Abs Immature Granulocytes 0.21 (H) 0.00 - 0.07 K/uL    Comment: Performed at [facility], [place] [address] [place], [place] [address]  Comprehensive metabolic panel     Status: Abnormal   Collection Time: 03/10/22  5:25 PM  Result Value Ref Range   Sodium 136 135 - 145 mmol/L   Potassium 3.8 3.5 - 5.1 mmol/L   Chloride 99 98 - 111 mmol/L   CO2 22 22 - 32 mmol/L   Glucose, Bld 114 (H) 70 - 99 mg/dL    Comment: Glucose reference range applies only to samples  taken after fasting for at least 8 hours.   BUN 34 (H) 6 - 20 mg/dL   Creatinine, Ser 2.04 (H) 0.44 - 1.00 mg/dL   Calcium 9.0 8.9 - 10.3 mg/dL   Total Protein 7.9 6.5 - 8.1 g/dL   Albumin 3.9 3.5 - 5.0 g/dL   AST 14 (  L) 15 - 41 U/L   ALT 13 0 - 44 U/L   Alkaline Phosphatase 90 38 - 126 U/L   Total Bilirubin 1.0 0.3 - 1.2 mg/dL   GFR, Estimated 28 (L) >60 mL/min    Comment: (NOTE) Calculated using the CKD-EPI Creatinine Equation (2021)    Anion gap 15 5 - 15    Comment: Performed at [facility], [place] [address], [place], [place] [address]  Lipase, blood     Status: None   Collection Time: 03/10/22  5:25 PM  Result Value Ref Range   Lipase 29 11 - 51 U/L    Comment: Performed at [facility], [place] [address], [place], [place] [address]    CT ABDOMEN PELVIS WO CONTRAST  Result Date: 03/10/2022 CLINICAL DATA:  Acute abdominal pain. Bilateral lower quadrant pain. EXAM: CT ABDOMEN AND PELVIS WITHOUT CONTRAST TECHNIQUE: Multidetector CT imaging of the abdomen and pelvis was performed following the standard protocol without IV contrast. RADIATION DOSE REDUCTION: This exam was performed according to the departmental dose-optimization program which includes automated exposure control, adjustment of the mA and/or kV according to patient size and/or use of iterative reconstruction technique. COMPARISON:  None Available. FINDINGS: Lower chest: Subsegmental atelectasis in the lingula, right middle and anterior left lower lobe. No consolidation or pleural effusion. The heart is normal in size. Hepatobiliary: No focal hepatic abnormality. Left lobe of the liver is slightly enlarged. Clips in the gallbladder fossa postcholecystectomy. No biliary dilatation. Pancreas: No ductal dilatation or inflammation. Spleen: Normal in size without focal abnormality. Adrenals/Urinary Tract: Normal adrenal glands. No hydronephrosis or renal calculi. No perinephric edema. Ureters are  decompressed. The urinary bladder is completely empty. There is mild bladder wall thickening and adjacent fat stranding. Stomach/Bowel: Findings consistent with acute appendicitis. The appendix is dilated, fluid-filled with periappendiceal fat stranding. There is an appendicolith at the base of the appendix. The appendiceal wall is poorly defined raising concern for early perforation. Peri appendiceal as well as generalized fat stranding in the right lower quadrant and pelvis. Small amount of free fluid in the right pericolic gutter. No free air or drainable abscess. Mild high density in the cecum is likely ingested material. Colon is fluid-filled with air-fluid levels. Sigmoid colonic diverticulosis. Fat stranding about the mid sigmoid in the region of multiple diverticula, favored to be reactive related to appendicitis. Small bowel is mildly fluid-filled but nondilated, no obstruction. Mild wall thickening about the distal and terminal ileum, felt to be reactive. Tiny hiatal hernia. Vascular/Lymphatic: Mild aortic atherosclerosis. No aortic aneurysm. Scattered small mesenteric and retroperitoneal nodes, typically reactive. This includes prominent ileocolic nodes. All lymph nodes are subcentimeter short axis. Reproductive: The uterus is retroverted.  No adnexal mass. Other: Prominent fat stranding in the pelvis as well as right lower quadrant. Small amount of non organized free fluid in the right pericolic gutter tracking into the pelvis. No organized or drainable collection. No free air. No abdominal wall hernia. Musculoskeletal: Lower lumbar facet hypertrophy. L5-S1 degenerative disc disease. There are no acute or suspicious osseous abnormalities. IMPRESSION: 1. Acute appendicitis with an appendicolith at the base of the appendix. The appendiceal wall is poorly defined raising concern for early or impending perforation. No free air or drainable abscess. 2. Sigmoid colonic diverticulosis. Fat stranding about the  mid sigmoid in the region of multiple diverticula, favored to be reactive related to appendicitis rather than concurrent diverticulitis. 3. Mild bladder wall thickening and adjacent fat stranding. This likely is secondary  to adjacent bowel inflammation, although recommend correlation with urinalysis to exclude urinary tract infection. Aortic Atherosclerosis (ICD10-I70.0). Electronically Signed   By: Keith Rake M.D.   On: 03/10/2022 18:33    I have personally reviewed the relevant laboratory data and CT scan  A/P: Acute appendicitis with appendicolith and possible perforation.  I explained the diagnosis to the patient and her family. She does have a significantly elevated white blood count as well as elevated creatinine and I am worried that she may have perforated.  A laparoscopic appendectomy is recommended.  I discussed the reasons for this with them. I explained the surgical procedure in detail.  I discussed the risks which includes but is not limited to bleeding, infection, injury to surrounding structures, appendiceal stump leak, the need for further procedures, the need to convert to an open procedure, cardiopulmonary issues, etc.  An IV fluid bolus has been given and she is all received IV antibiotics.  We will monitor her creatinine postoperatively.  I suspect it is secondary to dehydration. They understand and she agrees to proceed with surgery which is scheduled urgently.  Moderate medical decision making   Coralie Keens, MD [facility], [facility]

## 2022-03-10 NOTE — ED Triage Notes (Signed)
Pt c/o generalized abdominal pain since Monday with nausea. Pt states that today she began having diarrhea earlier today. Referred to ED by PCP due to hearing a bruit during exam.

## 2022-03-10 NOTE — Transfer of Care (Signed)
Immediate Anesthesia Transfer of Care Note  Patient: Jane Wong  Procedure(s) Performed: APPENDECTOMY LAPAROSCOPIC  Patient Location: PACU  Anesthesia Type:General  Level of Consciousness: awake and alert   Airway & Oxygen Therapy: Patient Spontanous Breathing and Patient connected to face mask oxygen  Post-op Assessment: Report given to RN and Post -op Vital signs reviewed and stable  Post vital signs: Reviewed and stable  Last Vitals:  Vitals Value Taken Time  BP    Temp    Pulse    Resp 21 03/10/22 2201  SpO2    Vitals shown include unvalidated device data.  Last Pain:  Vitals:   03/10/22 2033  TempSrc:   PainSc: 4          Complications: No notable events documented.

## 2022-03-10 NOTE — ED Provider Notes (Signed)
[facility] Provider Note   CSN: [id number] Arrival date & time: 03/10/22  1654     History  Chief Complaint  Patient presents with   Abdominal Pain    Jane Wong is a 61 y.o. female.  Presented to the emergency department due to concern for abdominal pain.  4 days of constant diffuse abdominal pain a/w nausea and anorexia. Patient reports abdominal pain originated in the suprapubic region and over the last several days has migrated up towards her epigastrium and now to her bilateral flanks. Pain remains constant at a 7/10 and is worse with applied pressure. She also c/o non-bloody diarrhea that started earlier today. In the ED she c/o a 4/10 diffuse headache which she attributes to caffeine withdrawal. She has not been eating regularly secondary to persistent nausea and recent GERD, but notes she is able to tolerate water in small amounts. No vomiting. Of note, patient reports mild 1-2 seconds of dizziness when shifting from sitting to standing over the past day. She last took Ibuprofen 10 hours ago which provided minimal to no relief. No fevers, vision changes, CP, SOB, or urinary complaints.   HPI     Home Medications Prior to Admission medications   Medication Sig Start Date End Date Taking? Authorizing Provider  B Complex-C (SUPER B COMPLEX PO) Take by mouth.    [provider]  BISACODYL 5 MG EC tablet Take by mouth as directed. 03/20/20   [provider]  Calcium Carb-Cholecalciferol (CALCIUM 600 + D PO) Take by mouth.    [provider]  cetirizine (ZYRTEC) 10 MG tablet Take 10 mg by mouth daily.    [provider]  diclofenac (VOLTAREN) 75 MG EC tablet Take 1 tablet (75 mg total) by mouth 2 (two) times daily. 02/17/20   Wallene Huh, DPM  lisinopril (PRINIVIL,ZESTRIL) 20 MG tablet 1 TABLET ONCE A DAY ORALLY 30 DAY(S) 02/11/15   [provider]  neomycin-polymyxin-hydrocortisone (CORTISPORIN) OTIC solution Apply  1-2 drops to toe after soaking twice a day 08/23/19   Wallene Huh, DPM  TRILYTE 420 G solution See admin instructions. 01/29/15   [provider]      Allergies    Penicillins    Review of Systems   Review of Systems  Physical Exam Updated Vital Signs BP 106/62   Pulse 73   Temp 98.6 F (37 C) (Oral)   Resp 20   Ht '5\' 4"'$  (1.626 m)   Wt 96.6 kg   LMP 01/10/2018 (Approximate)   SpO2 100%   BMI 36.56 kg/m  Physical Exam  ED Results / Procedures / Treatments   Labs (all labs ordered are listed, but only abnormal results are displayed) Labs Reviewed  CBC WITH DIFFERENTIAL/PLATELET - Abnormal; Notable for the following components:      Result Value   WBC 18.3 (*)    Neutro Abs 15.9 (*)    Abs Immature Granulocytes 0.21 (*)    All other components within normal limits  COMPREHENSIVE METABOLIC PANEL - Abnormal; Notable for the following components:   Glucose, Bld 114 (*)    BUN 34 (*)    Creatinine, Ser 2.04 (*)    AST 14 (*)    GFR, Estimated 28 (*)    All other components within normal limits  LIPASE, BLOOD  URINALYSIS, ROUTINE W REFLEX MICROSCOPIC  SURGICAL PATHOLOGY    EKG None  Radiology CT ABDOMEN PELVIS WO CONTRAST  Result Date: 03/10/2022 CLINICAL DATA:  Acute abdominal pain. Bilateral lower quadrant pain. EXAM: CT ABDOMEN AND PELVIS WITHOUT CONTRAST TECHNIQUE: Multidetector CT imaging of the abdomen and pelvis was performed following the standard protocol without IV contrast. RADIATION DOSE REDUCTION: This exam was performed according to the departmental dose-optimization program which includes automated exposure control, adjustment of the mA and/or kV according to patient size and/or use of iterative reconstruction technique. COMPARISON:  None Available. FINDINGS: Lower chest: Subsegmental atelectasis in the lingula, right middle and anterior left lower lobe. No consolidation or pleural effusion. The heart is normal in size. Hepatobiliary: No focal  hepatic abnormality. Left lobe of the liver is slightly enlarged. Clips in the gallbladder fossa postcholecystectomy. No biliary dilatation. Pancreas: No ductal dilatation or inflammation. Spleen: Normal in size without focal abnormality. Adrenals/Urinary Tract: Normal adrenal glands. No hydronephrosis or renal calculi. No perinephric edema. Ureters are decompressed. The urinary bladder is completely empty. There is mild bladder wall thickening and adjacent fat stranding. Stomach/Bowel: Findings consistent with acute appendicitis. The appendix is dilated, fluid-filled with periappendiceal fat stranding. There is an appendicolith at the base of the appendix. The appendiceal wall is poorly defined raising concern for early perforation. Peri appendiceal as well as generalized fat stranding in the right lower quadrant and pelvis. Small amount of free fluid in the right pericolic gutter. No free air or drainable abscess. Mild high density in the cecum is likely ingested material. Colon is fluid-filled with air-fluid levels. Sigmoid colonic diverticulosis. Fat stranding about the mid sigmoid in the region of multiple diverticula, favored to be reactive related to appendicitis. Small bowel is mildly fluid-filled but nondilated, no obstruction. Mild wall thickening about the distal and terminal ileum, felt to be reactive. Tiny hiatal hernia. Vascular/Lymphatic: Mild aortic atherosclerosis. No aortic aneurysm. Scattered small mesenteric and retroperitoneal nodes, typically reactive. This includes prominent ileocolic nodes. All lymph nodes are subcentimeter short axis. Reproductive: The uterus is retroverted.  No adnexal mass. Other: Prominent fat stranding in the pelvis as well as right lower quadrant. Small amount of non organized free fluid in the right pericolic gutter tracking into the pelvis. No organized or drainable collection. No free air. No abdominal wall hernia. Musculoskeletal: Lower lumbar facet hypertrophy.  L5-S1 degenerative disc disease. There are no acute or suspicious osseous abnormalities. IMPRESSION: 1. Acute appendicitis with an appendicolith at the base of the appendix. The appendiceal wall is poorly defined raising concern for early or impending perforation. No free air or drainable abscess. 2. Sigmoid colonic diverticulosis. Fat stranding about the mid sigmoid in the region of multiple diverticula, favored to be reactive related to appendicitis rather than concurrent diverticulitis. 3. Mild bladder wall thickening and adjacent fat stranding. This likely is secondary to adjacent bowel inflammation, although recommend correlation with urinalysis to exclude urinary tract infection. Aortic Atherosclerosis (ICD10-I70.0). Electronically Signed   By: Keith Rake M.D.   On: 03/10/2022 18:33    Procedures Procedures    Medications Ordered in ED Medications  oxyCODONE (Oxy IR/ROXICODONE) immediate release tablet 5 mg (has no administration in time range)    Or  oxyCODONE (ROXICODONE) 5 MG/5ML solution 5 mg (has no administration in time range)  fentaNYL (SUBLIMAZE) injection 25-50 mcg (has no administration in time range)  ondansetron (ZOFRAN) injection 4 mg (has no administration in time range)  lactated ringers irrigation solution (2,000 mLs Irrigation Given 03/10/22 2127)  ondansetron (ZOFRAN) injection 4 mg (4 mg Intravenous Given 03/10/22 1807)  lactated ringers bolus 1,000 mL (1,000 mLs Intravenous New Bag/Given 03/10/22 1827)  ceFEPIme (  MAXIPIME) 2 g in sodium chloride 0.9 % 100 mL IVPB (0 g Intravenous Stopped 03/10/22 2022)    And  metroNIDAZOLE (FLAGYL) IVPB 500 mg (500 mg Intravenous New Bag/Given 03/10/22 2025)  fentaNYL (SUBLIMAZE) injection 50 mcg (50 mcg Intravenous Given 03/10/22 1909)  metoCLOPramide (REGLAN) injection 5 mg (5 mg Intravenous Given 03/10/22 1909)    ED Course/ Medical Decision Making/ A&P                           Medical Decision Making Risk Prescription drug  management. Decision regarding hospitalization.   61 year old lady who presented to the emergency room due to concern for abdominal pain.  On exam well-appearing no distress, initial BP documented was soft but this resolved with small amount of fluids.  Blood work reviewed, leukocytosis noted.  AKI.  CT scan obtained, I independently reviewed and interpreted results.  Findings concerning for appendicitis.  Gave patient antibiotics, pain, nausea medicine.  Consulted general surgery, Dr. Ninfa Linden.  Discussed case with surgeon, he will admit for appendectomy.  Patient and daughter updated throughout stay.        Final Clinical Impression(s) / ED Diagnoses Final diagnoses:  Acute appendicitis with localized peritonitis, without perforation, abscess, or gangrene  Leukocytosis, unspecified type    Rx / DC Orders ED Discharge Orders     None         Lucrezia Starch, MD 03/10/22 2140

## 2022-03-10 NOTE — ED Provider Triage Note (Signed)
Emergency Medicine Provider Triage Evaluation Note  CASSI JENNE , a 61 y.o. female  was evaluated in triage.  Pt complains of chest pain.  It is upper abdominal chest pain, also in the right lower quadrant and left lower quadrant.  Space weight "all over the abdomen".  She feels lightheaded as if she is going to pass out, denies chest pain or shortness of breath.  She is nauseated has not vomited, endorses diarrhea.    On lisinopril for blood pressure, normally is hypertensive not hypotensive..  Review of Systems  Per HPI  Physical Exam  BP 91/60 (BP Location: Left Arm)   Pulse 87   Temp 98.6 F (37 C) (Oral)   Resp 16   Ht '5\' 4"'$  (1.626 m)   Wt 96.6 kg   LMP 01/10/2018 (Approximate)   SpO2 100%   BMI 36.56 kg/m  Gen:   Awake, no distress   Resp:  Normal effort  MSK:   Moves extremities without difficulty  Other:  Diffuse abdominal tenderness  Medical Decision Making  Medically screening exam initiated at 5:15 PM.  Appropriate orders placed.  SOHA THORUP was informed that the remainder of the evaluation will be completed by another provider, this initial triage assessment does not replace that evaluation, and the importance of remaining in the ED until their evaluation is complete.     Sherrill Raring, PA-C 03/10/22 1715

## 2022-03-10 NOTE — Op Note (Signed)
APPENDECTOMY LAPAROSCOPIC  Procedure Note  Jane Wong 03/10/2022   Pre-op Diagnosis: Acute appendicitis     Post-op Diagnosis: Perforated appendicitis  Procedure(s): APPENDECTOMY LAPAROSCOPIC  Surgeon(s): Coralie Keens, MD  Anesthesia: General  Staff:  Circulator: Lurena Joiner, RN Scrub Person: Romero Liner, CST  Estimated Blood Loss: Minimal               Specimens: Appendix sent to pathology  Findings: The patient was found to have perforated appendicitis with a hole distal to the base of the appendix with surrounding stool leakage and purulence the distal appendix was necrotic and contracted.  The mesocolon the sigmoid colon was over to the right lower quadrant and was secondarily inflamed.  Procedure: The patient was brought to operating identifies correct patient.  She is placed upon the operating table general anesthesia was induced.  Her abdomen was then prepped and draped in the usual sterile fashion.  I made a vertical incision below the umbilicus with a scalpel.  I carried this down to the fascia which was then opened with a scalpel.  Hemostat was then used to pass into the peritoneal cavity under direct vision.  A 0 Vicryl pursestring suture was placed around the fascial opening.  The [facility] port was placed the opening and insufflation of the abdomen was begun.  I placed another 5 mm trocar in the patient's right upper quadrant and 1 in the left lower quadrant both under direct vision.  The patient had a fairly floppy cecum.  There was a phlegmon in the right lower quadrant.  The loop of the sigmoid colon and mesocolon was pulled over into the right lower quadrant.  When I freed this up out of the right lower quadrant, a contracted, necrotic appendix with some surrounding purulent fluid and leaking stool was identified.  I was able to elevate the appendix and the perforation could be seen distal to the base of the appendix.  There was basically no  mesoappendix left at the base.  I was able to transect the base of the appendix with a laparoscopic GIA stapler.  The appendix was then placed in an Endosac and removed through the incision at the umbilicus.  I then copiously irrigated the abdomen and right lower quadrant with 2 L normal saline.  The staple line at the appendiceal stump was intact and there was no evidence of any leak at the stump or the cecum.  Hemostasis appeared to be achieved.  All trocars were then removed under direct vision and the abdomen was deflated.  The 0 Vicryl at the umbilicus was tied in place closing the fascial defect.  All incisions were then anesthetized with Marcaine and closed with 4-0 Monocryl sutures.  Dermabond was then applied.  The patient tolerated the procedure well.  All the counts were correct at the end of the procedure.  The patient was then extubated in the operating room and taken in stable condition to the recovery room.          Coralie Keens   Date: 03/10/2022  Time: 9:48 PM

## 2022-03-11 ENCOUNTER — Other Ambulatory Visit: Payer: Self-pay

## 2022-03-11 ENCOUNTER — Encounter (HOSPITAL_COMMUNITY): Payer: Self-pay | Admitting: Surgery

## 2022-03-11 DIAGNOSIS — K3532 Acute appendicitis with perforation and localized peritonitis, without abscess: Secondary | ICD-10-CM | POA: Diagnosis present

## 2022-03-11 LAB — CBC
HCT: 34.2 % — ABNORMAL LOW (ref 36.0–46.0)
Hemoglobin: 11.2 g/dL — ABNORMAL LOW (ref 12.0–15.0)
MCH: 29.4 pg (ref 26.0–34.0)
MCHC: 32.7 g/dL (ref 30.0–36.0)
MCV: 89.8 fL (ref 80.0–100.0)
Platelets: 313 10*3/uL (ref 150–400)
RBC: 3.81 MIL/uL — ABNORMAL LOW (ref 3.87–5.11)
RDW: 12 % (ref 11.5–15.5)
WBC: 11.8 10*3/uL — ABNORMAL HIGH (ref 4.0–10.5)
nRBC: 0 % (ref 0.0–0.2)

## 2022-03-11 LAB — BASIC METABOLIC PANEL
Anion gap: 12 (ref 5–15)
BUN: 34 mg/dL — ABNORMAL HIGH (ref 6–20)
CO2: 19 mmol/L — ABNORMAL LOW (ref 22–32)
Calcium: 8.4 mg/dL — ABNORMAL LOW (ref 8.9–10.3)
Chloride: 104 mmol/L (ref 98–111)
Creatinine, Ser: 1.32 mg/dL — ABNORMAL HIGH (ref 0.44–1.00)
GFR, Estimated: 47 mL/min — ABNORMAL LOW (ref >60)
Glucose, Bld: 168 mg/dL — ABNORMAL HIGH (ref 70–99)
Potassium: 4.3 mmol/L (ref 3.5–5.1)
Sodium: 135 mmol/L (ref 135–145)

## 2022-03-11 MED ORDER — METHOCARBAMOL 500 MG PO TABS: 500.0000 mg | ORAL_TABLET | Freq: Four times a day (QID) | ORAL | Status: DC | PRN

## 2022-03-11 NOTE — Discharge Instructions (Signed)
[facility]  Please arrive at least 30 min before your appointment to complete your check in paperwork.  If you are unable to arrive 30 min prior to your appointment time we may have to cancel or reschedule you. LAPAROSCOPIC SURGERY: POST OP INSTRUCTIONS Always review your discharge instruction sheet given to you by the facility where your surgery was performed. IF YOU HAVE DISABILITY OR FAMILY LEAVE FORMS, YOU MUST BRING THEM TO THE OFFICE FOR PROCESSING.   DO NOT GIVE THEM TO YOUR DOCTOR.  PAIN CONTROL  First take acetaminophen (Tylenol) AND/or ibuprofen (Advil) to control your pain after surgery.  Follow directions on package.  Taking acetaminophen (Tylenol) and/or ibuprofen (Advil) regularly after surgery will help to control your pain and lower the amount of prescription pain medication you may need.  You should not take more than 4,000 mg (4 grams) of acetaminophen (Tylenol) in 24 hours.  You should not take ibuprofen (Advil), aleve, motrin, naprosyn or other NSAIDS if you have a history of stomach ulcers or chronic kidney disease.  A prescription for pain medication may be given to you upon discharge.  Take your pain medication as prescribed, if you still have uncontrolled pain after taking acetaminophen (Tylenol) or ibuprofen (Advil). Use ice packs to help control pain. If you need a refill on your pain medication, please contact your pharmacy.  They will contact our office to request authorization. Prescriptions will not be filled after 5pm or on week-ends.  HOME MEDICATIONS Take your usually prescribed medications unless otherwise directed.  DIET You should follow a light diet the first few days after arrival home.  Be sure to include lots of fluids daily. Avoid fatty, fried foods.   CONSTIPATION It is common to experience some constipation after surgery and if you are taking pain medication.  Increasing fluid intake and taking a stool softener (such as Colace)  will usually help or prevent this problem from occurring.  A mild laxative (Milk of Magnesia or Miralax) should be taken according to package instructions if there are no bowel movements after 48 hours.  WOUND/INCISION CARE Most patients will experience some swelling and bruising in the area of the incisions.  Ice packs will help.  Swelling and bruising can take several days to resolve.  Unless discharge instructions indicate otherwise, follow guidelines below  STERI-STRIPS - you may remove your outer bandages 48 hours after surgery, and you may shower at that time.  You have steri-strips (small skin tapes) in place directly over the incision.  These strips should be left on the skin for 7-10 days.   DERMABOND/SKIN GLUE - you may shower in 24 hours.  The glue will flake off over the next 2-3 weeks. Any sutures or staples will be removed at the office during your follow-up visit.  ACTIVITIES You may resume regular (light) daily activities beginning the next day--such as daily self-care, walking, climbing stairs--gradually increasing activities as tolerated.  You may have sexual intercourse when it is comfortable.  Refrain from any heavy lifting or straining until approved by your doctor. You may drive when you are no longer taking prescription pain medication, you can comfortably wear a seatbelt, and you can safely maneuver your car and apply brakes.  FOLLOW-UP You should see your doctor in the office for a follow-up appointment approximately 2-3 weeks after your surgery.  You should have been given your post-op/follow-up appointment when your surgery was scheduled.  If you did not receive a post-op/follow-up appointment, make sure   that you call for this appointment within a day or two after you arrive home to insure a convenient appointment time.  OTHER INSTRUCTIONS  WHEN TO CALL YOUR DOCTOR: Fever over 101.0 Inability to urinate Continued bleeding from incision. Increased pain, redness, or  drainage from the incision. Increasing abdominal pain  The clinic staff is available to answer your questions during regular business hours.  Please don't hesitate to call and ask to speak to one of the nurses for clinical concerns.  If you have a medical emergency, go to the nearest emergency room or call 911.  A surgeon from [facility] is always on call at the hospital. [address], [address], [place], [place]  [address] ? [address] [address], [place], [place]   [address] [phone] ? [phone] ? FAX [phone]   

## 2022-03-11 NOTE — Progress Notes (Signed)
[facility] Progress Note  1 Day Post-Op  Subjective: CC:  States she feels much better than yesterday.  Reports incisional abdominal pain, worse with movement.  Also endorses some pelvic pressure with urination.  Denies flatus or BM yet.  Has been out of bed to the bathroom independently.  Denies belching, nausea, vomiting.  States her daughter is a Marine scientist.  Objective: Vital signs in last 24 hours: Temp:  [97.8 F (36.6 C)-98.6 F (37 C)] 97.8 F (36.6 C) (07/07 0920) Pulse Rate:  [71-99] 72 (07/07 0920) Resp:  [13-25] 14 (07/07 0920) BP: (81-126)/(40-86) 126/66 (07/07 0920) SpO2:  [91 %-100 %] 96 % (07/07 0920) Weight:  [96.6 kg] 96.6 kg (07/06 1705) Last BM Date : 03/10/22  Intake/Output from previous day: 07/06 0701 - 07/07 0700 In: 1329.8 [P.O.:360; I.V.:969.8] Out: 610 [Urine:600; Blood:10] Intake/Output this shift: Total I/O In: -  Out: 500 [Urine:500]  PE: Gen:  Alert, NAD, pleasant Card:  Regular rate and rhythm Pulm:  Normal effort Abd: Soft, appropriately tender around incisions, some TTP RLQ without guarding, +BS Skin: warm and dry, no rashes  Psych: A&Ox3   Lab Results:  Recent Labs    03/10/22 1725 03/11/22 0530  WBC 18.3* 11.8*  HGB 12.9 11.2*  HCT 38.4 34.2*  PLT 360 313   BMET Recent Labs    03/10/22 1725 03/11/22 0530  NA 136 135  K 3.8 4.3  CL 99 104  CO2 22 19*  GLUCOSE 114* 168*  BUN 34* 34*  CREATININE 2.04* 1.32*  CALCIUM 9.0 8.4*   PT/INR No results for input(s): "LABPROT", "INR" in the last 72 hours. CMP     Component Value Date/Time   NA 135 03/11/2022 0530   K 4.3 03/11/2022 0530   CL 104 03/11/2022 0530   CO2 19 (L) 03/11/2022 0530   GLUCOSE 168 (H) 03/11/2022 0530   BUN 34 (H) 03/11/2022 0530   CREATININE 1.32 (H) 03/11/2022 0530   CALCIUM 8.4 (L) 03/11/2022 0530   PROT 7.9 03/10/2022 1725   ALBUMIN 3.9 03/10/2022 1725   AST 14 (L) 03/10/2022 1725   ALT 13 03/10/2022 1725   ALKPHOS 90 03/10/2022  1725   BILITOT 1.0 03/10/2022 1725   GFRNONAA 47 (L) 03/11/2022 0530   Lipase     Component Value Date/Time   LIPASE 29 03/10/2022 1725       Studies/Results: CT ABDOMEN PELVIS WO CONTRAST  Result Date: 03/10/2022 CLINICAL DATA:  Acute abdominal pain. Bilateral lower quadrant pain. EXAM: CT ABDOMEN AND PELVIS WITHOUT CONTRAST TECHNIQUE: Multidetector CT imaging of the abdomen and pelvis was performed following the standard protocol without IV contrast. RADIATION DOSE REDUCTION: This exam was performed according to the departmental dose-optimization program which includes automated exposure control, adjustment of the mA and/or kV according to patient size and/or use of iterative reconstruction technique. COMPARISON:  None Available. FINDINGS: Lower chest: Subsegmental atelectasis in the lingula, right middle and anterior left lower lobe. No consolidation or pleural effusion. The heart is normal in size. Hepatobiliary: No focal hepatic abnormality. Left lobe of the liver is slightly enlarged. Clips in the gallbladder fossa postcholecystectomy. No biliary dilatation. Pancreas: No ductal dilatation or inflammation. Spleen: Normal in size without focal abnormality. Adrenals/Urinary Tract: Normal adrenal glands. No hydronephrosis or renal calculi. No perinephric edema. Ureters are decompressed. The urinary bladder is completely empty. There is mild bladder wall thickening and adjacent fat stranding. Stomach/Bowel: Findings consistent with acute appendicitis. The appendix is dilated, fluid-filled with periappendiceal fat  stranding. There is an appendicolith at the base of the appendix. The appendiceal wall is poorly defined raising concern for early perforation. Peri appendiceal as well as generalized fat stranding in the right lower quadrant and pelvis. Small amount of free fluid in the right pericolic gutter. No free air or drainable abscess. Mild high density in the cecum is likely ingested material.  Colon is fluid-filled with air-fluid levels. Sigmoid colonic diverticulosis. Fat stranding about the mid sigmoid in the region of multiple diverticula, favored to be reactive related to appendicitis. Small bowel is mildly fluid-filled but nondilated, no obstruction. Mild wall thickening about the distal and terminal ileum, felt to be reactive. Tiny hiatal hernia. Vascular/Lymphatic: Mild aortic atherosclerosis. No aortic aneurysm. Scattered small mesenteric and retroperitoneal nodes, typically reactive. This includes prominent ileocolic nodes. All lymph nodes are subcentimeter short axis. Reproductive: The uterus is retroverted.  No adnexal mass. Other: Prominent fat stranding in the pelvis as well as right lower quadrant. Small amount of non organized free fluid in the right pericolic gutter tracking into the pelvis. No organized or drainable collection. No free air. No abdominal wall hernia. Musculoskeletal: Lower lumbar facet hypertrophy. L5-S1 degenerative disc disease. There are no acute or suspicious osseous abnormalities. IMPRESSION: 1. Acute appendicitis with an appendicolith at the base of the appendix. The appendiceal wall is poorly defined raising concern for early or impending perforation. No free air or drainable abscess. 2. Sigmoid colonic diverticulosis. Fat stranding about the mid sigmoid in the region of multiple diverticula, favored to be reactive related to appendicitis rather than concurrent diverticulitis. 3. Mild bladder wall thickening and adjacent fat stranding. This likely is secondary to adjacent bowel inflammation, although recommend correlation with urinalysis to exclude urinary tract infection. Aortic Atherosclerosis (ICD10-I70.0). Electronically Signed   By: Keith Rake M.D.   On: 03/10/2022 18:33    Anti-infectives: Anti-infectives (From admission, onward)    Start     Dose/Rate Route Frequency Ordered Stop   03/11/22 0800  metroNIDAZOLE (FLAGYL) IVPB 500 mg       See  Hyperspace for full Linked Orders Report.   500 mg 100 mL/hr over 60 Minutes Intravenous Every 12 hours 03/10/22 2322 03/16/22 0759   03/11/22 0700  ceFEPIme (MAXIPIME) 2 g in sodium chloride 0.9 % 100 mL IVPB       See Hyperspace for full Linked Orders Report.   2 g 200 mL/hr over 30 Minutes Intravenous Every 12 hours 03/10/22 2322 03/16/22 0659   03/11/22 0000  ceFEPIme (MAXIPIME) 2 g in sodium chloride 0.9 % 100 mL IVPB  Status:  Discontinued       See Hyperspace for full Linked Orders Report.   2 g 200 mL/hr over 30 Minutes Intravenous Every 8 hours 03/10/22 2309 03/10/22 2321   03/10/22 2309  metroNIDAZOLE (FLAGYL) IVPB 500 mg  Status:  Discontinued       See Hyperspace for full Linked Orders Report.   500 mg 100 mL/hr over 60 Minutes Intravenous Every 12 hours 03/10/22 2309 03/10/22 2321   03/10/22 1900  ceFEPIme (MAXIPIME) 2 g in sodium chloride 0.9 % 100 mL IVPB       See Hyperspace for full Linked Orders Report.   2 g 200 mL/hr over 30 Minutes Intravenous  Once 03/10/22 1848 03/10/22 2022   03/10/22 1900  metroNIDAZOLE (FLAGYL) IVPB 500 mg       See Hyperspace for full Linked Orders Report.   500 mg 100 mL/hr over 60 Minutes Intravenous  Once 03/10/22  7939 03/10/22 2125        Assessment/Plan Acute appendicitis with perforation Postop day #1 status post laparoscopic appendectomy 03/10/2022 by Dr. Ninfa Linden -Afebrile, vitals are within normal limits, white blood cell count is 11 and downtrending -AKI with a creatinine of 2.04 on admission, creatinine is improved to 1.32 today.  Continue IV fluids today at 75 cc/h -Advance diet to soft -Continue IV antibiotics -P.o. pain control - IS/pulm toilet. OOB and ambulate   Continue IV antibiotics and advance diet.  Anticipate patient will be stable for discharge in 24 to 48 hours if she is able to tolerate p.o., pain controlled, and no signs of increasing intra-abdominal infection or ileus (fever, tachycardia, leukocytosis,  nausea)   LOS: 1 day    Obie Dredge, [facility] Please see Amion for pager number during day hours 7:00am-4:30pm

## 2022-03-12 LAB — CBC
HCT: 29.9 % — ABNORMAL LOW (ref 36.0–46.0)
Hemoglobin: 9.7 g/dL — ABNORMAL LOW (ref 12.0–15.0)
MCH: 29.7 pg (ref 26.0–34.0)
MCHC: 32.4 g/dL (ref 30.0–36.0)
MCV: 91.4 fL (ref 80.0–100.0)
Platelets: 349 10*3/uL (ref 150–400)
RBC: 3.27 MIL/uL — ABNORMAL LOW (ref 3.87–5.11)
RDW: 12.4 % (ref 11.5–15.5)
WBC: 11.2 10*3/uL — ABNORMAL HIGH (ref 4.0–10.5)
nRBC: 0 % (ref 0.0–0.2)

## 2022-03-12 LAB — BASIC METABOLIC PANEL
Anion gap: 9 (ref 5–15)
BUN: 23 mg/dL — ABNORMAL HIGH (ref 6–20)
CO2: 21 mmol/L — ABNORMAL LOW (ref 22–32)
Calcium: 8.6 mg/dL — ABNORMAL LOW (ref 8.9–10.3)
Chloride: 108 mmol/L (ref 98–111)
Creatinine, Ser: 0.86 mg/dL (ref 0.44–1.00)
GFR, Estimated: >60 mL/min (ref >60)
Glucose, Bld: 179 mg/dL — ABNORMAL HIGH (ref 70–99)
Potassium: 4.4 mmol/L (ref 3.5–5.1)
Sodium: 138 mmol/L (ref 135–145)

## 2022-03-12 NOTE — Progress Notes (Signed)
Assessment & Plan: Acute appendicitis with perforation Postop day #2 status post laparoscopic appendectomy 03/10/2022 by Dr. Ninfa Linden  - taking limited soft diet, mostly liquids; some nausea - continue IV antibiotics - encouraged ambulation, OOB   Continue IV antibiotics and advance diet.  Check labs today.  Possibly home tomorrow if ileus improved.        Jane Gemma, MD [facility] [place] practice Office: [phone]        Chief Complaint: Acute appendicitis  Subjective: Patient in bed, "tired".  Some nausea, taking on liquids this AM.  Objective: Vital signs in last 24 hours: Temp:  [97.7 F (36.5 C)-98.7 F (37.1 C)] 97.7 F (36.5 C) (07/08 0503) Pulse Rate:  [66-74] 70 (07/08 0503) Resp:  [14-16] 14 (07/08 0503) BP: (118-138)/(56-95) 128/56 (07/08 0503) SpO2:  [95 %-96 %] 96 % (07/08 0503) Last BM Date : 03/10/22  Intake/Output from previous day: 07/07 0701 - 07/08 0700 In: 2641.2 [P.O.:1060; I.V.:1192; IV Piggyback:389.2] Out: 1500 [Urine:1500] Intake/Output this shift: No intake/output data recorded.  Physical Exam: HEENT - sclerae clear, mucous membranes moist Abdomen - soft, mild tenderness; few BS present Ext - no edema, non-tender Neuro - alert & oriented, no focal deficits  Lab Results:  Recent Labs    03/10/22 1725 03/11/22 0530  WBC 18.3* 11.8*  HGB 12.9 11.2*  HCT 38.4 34.2*  PLT 360 313   BMET Recent Labs    03/10/22 1725 03/11/22 0530  NA 136 135  K 3.8 4.3  CL 99 104  CO2 22 19*  GLUCOSE 114* 168*  BUN 34* 34*  CREATININE 2.04* 1.32*  CALCIUM 9.0 8.4*   PT/INR No results for input(s): "LABPROT", "INR" in the last 72 hours. Comprehensive Metabolic Panel:    Component Value Date/Time   NA 135 03/11/2022 0530   NA 136 03/10/2022 1725   K 4.3 03/11/2022 0530   K 3.8 03/10/2022 1725   CL 104 03/11/2022 0530   CL 99 03/10/2022 1725   CO2 19 (L) 03/11/2022 0530   CO2 22 03/10/2022 1725   BUN 34 (H)  03/11/2022 0530   BUN 34 (H) 03/10/2022 1725   CREATININE 1.32 (H) 03/11/2022 0530   CREATININE 2.04 (H) 03/10/2022 1725   GLUCOSE 168 (H) 03/11/2022 0530   GLUCOSE 114 (H) 03/10/2022 1725   CALCIUM 8.4 (L) 03/11/2022 0530   CALCIUM 9.0 03/10/2022 1725   AST 14 (L) 03/10/2022 1725   ALT 13 03/10/2022 1725   ALKPHOS 90 03/10/2022 1725   BILITOT 1.0 03/10/2022 1725   PROT 7.9 03/10/2022 1725   ALBUMIN 3.9 03/10/2022 1725    Studies/Results: CT ABDOMEN PELVIS WO CONTRAST  Result Date: 03/10/2022 CLINICAL DATA:  Acute abdominal pain. Bilateral lower quadrant pain. EXAM: CT ABDOMEN AND PELVIS WITHOUT CONTRAST TECHNIQUE: Multidetector CT imaging of the abdomen and pelvis was performed following the standard protocol without IV contrast. RADIATION DOSE REDUCTION: This exam was performed according to the departmental dose-optimization program which includes automated exposure control, adjustment of the mA and/or kV according to patient size and/or use of iterative reconstruction technique. COMPARISON:  None Available. FINDINGS: Lower chest: Subsegmental atelectasis in the lingula, right middle and anterior left lower lobe. No consolidation or pleural effusion. The heart is normal in size. Hepatobiliary: No focal hepatic abnormality. Left lobe of the liver is slightly enlarged. Clips in the gallbladder fossa postcholecystectomy. No biliary dilatation. Pancreas: No ductal dilatation or inflammation. Spleen: Normal in size without focal abnormality. Adrenals/Urinary Tract: Normal  adrenal glands. No hydronephrosis or renal calculi. No perinephric edema. Ureters are decompressed. The urinary bladder is completely empty. There is mild bladder wall thickening and adjacent fat stranding. Stomach/Bowel: Findings consistent with acute appendicitis. The appendix is dilated, fluid-filled with periappendiceal fat stranding. There is an appendicolith at the base of the appendix. The appendiceal wall is poorly defined  raising concern for early perforation. Peri appendiceal as well as generalized fat stranding in the right lower quadrant and pelvis. Small amount of free fluid in the right pericolic gutter. No free air or drainable abscess. Mild high density in the cecum is likely ingested material. Colon is fluid-filled with air-fluid levels. Sigmoid colonic diverticulosis. Fat stranding about the mid sigmoid in the region of multiple diverticula, favored to be reactive related to appendicitis. Small bowel is mildly fluid-filled but nondilated, no obstruction. Mild wall thickening about the distal and terminal ileum, felt to be reactive. Tiny hiatal hernia. Vascular/Lymphatic: Mild aortic atherosclerosis. No aortic aneurysm. Scattered small mesenteric and retroperitoneal nodes, typically reactive. This includes prominent ileocolic nodes. All lymph nodes are subcentimeter short axis. Reproductive: The uterus is retroverted.  No adnexal mass. Other: Prominent fat stranding in the pelvis as well as right lower quadrant. Small amount of non organized free fluid in the right pericolic gutter tracking into the pelvis. No organized or drainable collection. No free air. No abdominal wall hernia. Musculoskeletal: Lower lumbar facet hypertrophy. L5-S1 degenerative disc disease. There are no acute or suspicious osseous abnormalities. IMPRESSION: 1. Acute appendicitis with an appendicolith at the base of the appendix. The appendiceal wall is poorly defined raising concern for early or impending perforation. No free air or drainable abscess. 2. Sigmoid colonic diverticulosis. Fat stranding about the mid sigmoid in the region of multiple diverticula, favored to be reactive related to appendicitis rather than concurrent diverticulitis. 3. Mild bladder wall thickening and adjacent fat stranding. This likely is secondary to adjacent bowel inflammation, although recommend correlation with urinalysis to exclude urinary tract infection. Aortic  Atherosclerosis (ICD10-I70.0). Electronically Signed   By: Keith Rake M.D.   On: 03/10/2022 18:33      Jane Wong 03/12/2022   Patient ID: Jane Wong, female   DOB: 08-21-1961, 61 y.o.   MRN: [id number]

## 2022-03-12 NOTE — TOC Initial Note (Signed)
Transition of Care [facility]) - Initial/Assessment Note    Patient Details  Name: Jane Wong MRN: [id number] Date of Birth: Aug 20, 1961  [facility]) CM/SW Contact:    Leeroy Cha, RN Phone Number: 03/12/2022, 8:39 AM  Clinical Narrative:                  Transition of Care [facility]) Screening Note   Patient Details  Name: Jane Wong Date of Birth: 1961/04/03   Transition of Care [facility]) CM/SW Contact:    Leeroy Cha, RN Phone Number: 03/12/2022, 8:39 AM    Transition of Care Department [facility]) has reviewed patient and no TOC needs have been identified at this time. We will continue to monitor patient advancement through interdisciplinary progression rounds. If new patient transition needs arise, please place a TOC consult.    Expected Discharge Plan: Home/Self Care Barriers to Discharge: Continued Medical Work up   Patient Goals and CMS Choice Patient states their goals for this hospitalization and ongoing recovery are:: to go home      Expected Discharge Plan and Services Expected Discharge Plan: Home/Self Care   Discharge Planning Services: CM Consult   Living arrangements for the past 2 months: Single Family Home                                      Prior Living Arrangements/Services Living arrangements for the past 2 months: Single Family Home Lives with:: Spouse Patient language and need for interpreter reviewed:: Yes Do you feel safe going back to the place where you live?: Yes            Criminal Activity/Legal Involvement Pertinent to Current Situation/Hospitalization: No - Comment as needed  Activities of Daily Living Home Assistive Devices/Equipment: Eyeglasses ADL Screening (condition at time of admission) Patient's cognitive ability adequate to safely complete daily activities?: Yes Is the patient deaf or have difficulty hearing?: No Does the patient have difficulty seeing, even when wearing glasses/contacts?: No Does  the patient have difficulty concentrating, remembering, or making decisions?: No Patient able to express need for assistance with ADLs?: Yes Does the patient have difficulty dressing or bathing?: No Independently performs ADLs?: Yes (appropriate for developmental age) Does the patient have difficulty walking or climbing stairs?: No Weakness of Legs: None Weakness of Arms/Hands: None  Permission Sought/Granted                  Emotional Assessment Appearance:: Appears stated age     Orientation: : Oriented to Place, Oriented to Self, Oriented to  Time, Oriented to Situation Alcohol / Substance Use: Not Applicable Psych Involvement: No (comment)  Admission diagnosis:  Appendicitis [K37] Leukocytosis, unspecified type [D72.829] Acute appendicitis with localized peritonitis, without perforation, abscess, or gangrene [K35.30] Perforated appendicitis [K35.32] Appendicitis with perforation [K35.32] Patient Active Problem List   Diagnosis Date Noted   Appendicitis with perforation 03/11/2022   Appendicitis 03/10/2022   Perforated appendicitis 03/10/2022   Localized, primary osteoarthritis of hand 04/18/2019   Aftercare 07/25/2018   Ganglion cyst of volar aspect of left wrist 05/29/2018   Contusion of thumb 05/29/2018   PCP:  Carol Ada, MD Pharmacy:   [facility] [place], [place] - [address] [place] [address] Phone: [phone] Fax: [phone]     Social Determinants of Health (SDOH) Interventions    Readmission Risk Interventions  No data to display

## 2022-03-13 MED ORDER — SULFAMETHOXAZOLE-TRIMETHOPRIM 800-160 MG PO TABS: 1.0000 | ORAL_TABLET | Freq: Two times a day (BID) | ORAL | 0 refills | Status: AC

## 2022-03-13 NOTE — Discharge Summary (Signed)
Physician Discharge Summary   Patient ID: Jane Wong MRN: [id number] DOB/AGE: [birth year] 61 y.o.  Admit date: 03/10/2022  Discharge date: 03/13/2022  Discharge Diagnoses:  Principal Problem:   Appendicitis Active Problems:   Perforated appendicitis   Appendicitis with perforation   Discharged Condition: good  Hospital Course: Patient was admitted for observation following laparoscopic appendectomy surgery.  Post op course was complicated by mild ileus.  Pain was well controlled.  Tolerated diet.  Patient was prepared for discharge home on POD#3.  Consults: None  Treatments: surgery: laparoscopic appendectomy - Dr. Nedra Hai  Discharge Exam: Blood pressure (!) 180/95, pulse 72, temperature 97.8 F (36.6 C), temperature source Oral, resp. rate 16, height '5\' 4"'$  (1.626 m), weight 96.6 kg, last menstrual period 01/10/2018, SpO2 98 %. HEENT - clear Neck - soft Abd - soft without distension; minimal tenderness; BS active  Disposition: Home  Discharge Instructions     Diet - low sodium heart healthy   Complete by: As directed    Increase activity slowly   Complete by: As directed    No dressing needed   Complete by: As directed       Allergies as of 03/13/2022       Reactions   Penicillins Hives, Rash        Medication List     TAKE these medications    bismuth subsalicylate 517 MG chewable tablet Commonly known as: PEPTO BISMOL Chew 524 mg by mouth daily as needed (stomach ache).   CALCIUM 600 + D PO Take 600 mg by mouth daily.   cetirizine 10 MG tablet Commonly known as: ZYRTEC Take 10 mg by mouth daily.   ibuprofen 200 MG tablet Commonly known as: ADVIL Take 400 mg by mouth as needed for headache.   levocetirizine 5 MG tablet Commonly known as: XYZAL Take 5 mg by mouth daily.   lisinopril 20 MG tablet Commonly known as: ZESTRIL Take 20 mg by mouth daily.   naproxen sodium 220 MG tablet Commonly known as: ALEVE Take 220 mg by mouth as  needed (inflamation).   neomycin-polymyxin-hydrocortisone OTIC solution Commonly known as: CORTISPORIN Apply 1-2 drops to toe after soaking twice a day   OVER THE COUNTER MEDICATION Take 11 mg by mouth daily. Collagen Powder   PROBIOTIC PO Take 1 tablet by mouth daily.   sulfamethoxazole-trimethoprim 800-160 MG tablet Commonly known as: BACTRIM DS Take 1 tablet by mouth 2 (two) times daily.   TriLyte 420 g solution Generic drug: polyethylene glycol-electrolytes See admin instructions.   TUMS PO Take 2 tablets by mouth daily as needed (heartburn).   VITAMIN C PO Take 2 tablets by mouth daily.   ZINC PO Take 1 tablet by mouth daily.               Discharge Care Instructions  (From admission, onward)           Start     Ordered   03/13/22 0000  No dressing needed        03/13/22 1122            [facility]-up [facility] Call.   Specialty: General Surgery Why: We are working on your appointment,call to confirm, Arrive 72mn early to check in, fill out paperwork, BEngineer, civil (consulting)ID and iDoctor, general practiceinformation: [phone]  Armandina Gemma, [place] Office: [phone]   Signed: Armandina Gemma 03/13/2022, 11:23 AM

## 2022-03-14 LAB — SURGICAL PATHOLOGY

## 2022-04-11 ENCOUNTER — Other Ambulatory Visit: Payer: Self-pay | Admitting: Family Medicine

## 2022-04-11 DIAGNOSIS — Z1231 Encounter for screening mammogram for malignant neoplasm of breast: Secondary | ICD-10-CM

## 2022-04-14 ENCOUNTER — Ambulatory Visit
Admission: RE | Admit: 2022-04-14 | Discharge: 2022-04-14 | Disposition: A | Discharge status: Home / Self Care | Payer: 59 | Source: Ambulatory Visit | Attending: Family Medicine | Admitting: Family Medicine

## 2022-04-14 DIAGNOSIS — Z1231 Encounter for screening mammogram for malignant neoplasm of breast: Secondary | ICD-10-CM

## 2022-05-26 ENCOUNTER — Ambulatory Visit (INDEPENDENT_AMBULATORY_CARE_PROVIDER_SITE_OTHER): Payer: 59 | Admitting: Podiatry

## 2022-05-26 ENCOUNTER — Encounter: Payer: Self-pay | Admitting: Podiatry

## 2022-05-26 DIAGNOSIS — L600 Ingrowing nail: Secondary | ICD-10-CM | POA: Diagnosis not present

## 2022-05-26 NOTE — Patient Instructions (Signed)

## 2022-05-26 NOTE — Progress Notes (Signed)
Subjective:   Patient ID: Jane Wong, female   DOB: 61 y.o.   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  Today I infiltrated each hallux 60 from psych Marcaine mixture sterile prep was done and using sterile instrumentation removed the medial borders exposed matrix applied phenol 3 applications 30 seconds followed by alcohol lavage sterile dressing.  Instructed to soaks and leave dressings on 24 hours take them off earlier if throbbing were to occur and encouraged her to call questions concerns which may arise

## 2022-11-28 ENCOUNTER — Other Ambulatory Visit (HOSPITAL_COMMUNITY)
Admission: RE | Admit: 2022-11-28 | Discharge: 2022-11-28 | Disposition: A | Discharge status: Home / Self Care | Payer: 59 | Source: Ambulatory Visit | Attending: Obstetrics and Gynecology | Admitting: Obstetrics and Gynecology

## 2022-11-28 DIAGNOSIS — Z01419 Encounter for gynecological examination (general) (routine) without abnormal findings: Secondary | ICD-10-CM | POA: Insufficient documentation

## 2022-11-30 LAB — CYTOLOGY - PAP
Comment: NEGATIVE
Diagnosis: NEGATIVE
High risk HPV: NEGATIVE

## 2023-01-05 ENCOUNTER — Other Ambulatory Visit (HOSPITAL_COMMUNITY): Payer: Self-pay | Admitting: Family Medicine

## 2023-01-05 ENCOUNTER — Encounter: Payer: Self-pay | Admitting: Family Medicine

## 2023-01-05 DIAGNOSIS — Z8249 Family history of ischemic heart disease and other diseases of the circulatory system: Secondary | ICD-10-CM

## 2023-01-22 IMAGING — MR MR ANKLE*L* W/O CM
5 series · 36 of 40 positions shown · non-contrast
Comparison: X-ray foot 08/20/2021.

CLINICAL DATA: Left medial ankle pain related to playing tennis
since [place] of 7176. History of bunionectomy. Clinical concern for
tendon injury or dislocation.

EXAM:
MRI OF THE LEFT ANKLE WITHOUT CONTRAST
TECHNIQUE: Multiplanar, multisequence MR imaging of the ankle was performed. No
intravenous contrast was administered.

[Series 4: T2 fat-sat · axial · 3.0mm · 0.50mm/px · z∈[-98,+23]mm · 9 of 32 slices shown (1 of 2)]
[im 1/32]
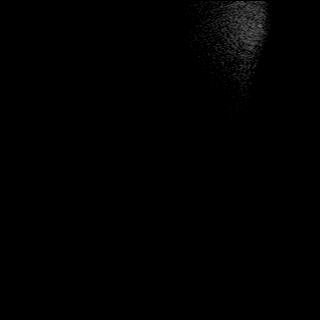
[im 4/32]
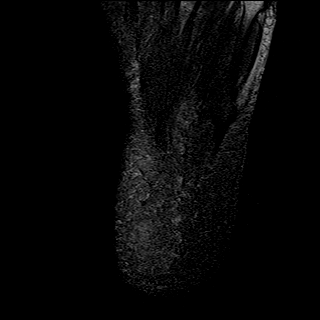
[im 8/32]
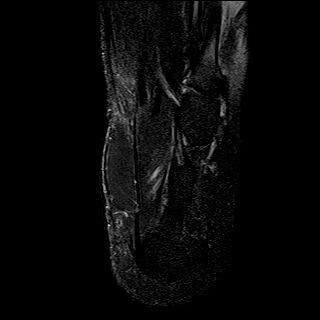
[im 12/32]
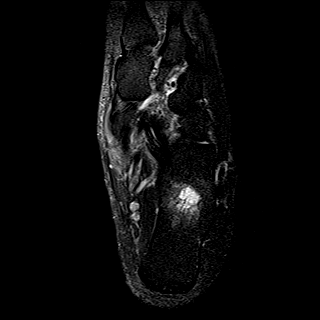
[im 16/32]
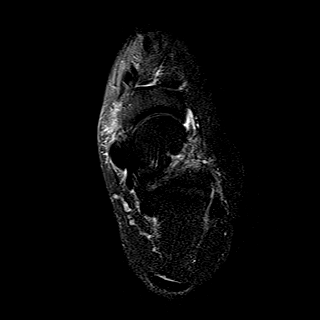
[im 20/32]
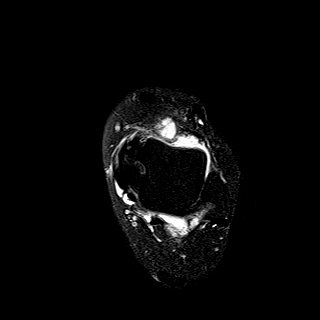
[im 24/32]
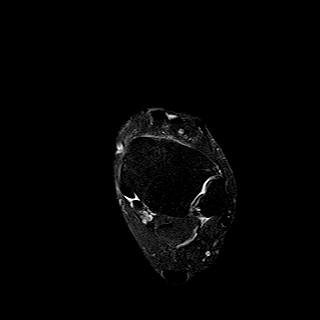
[im 28/32]
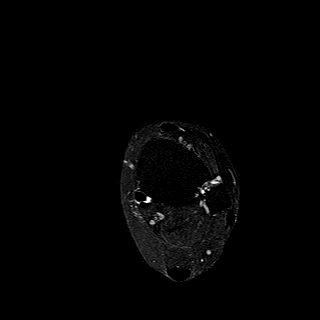
[im 32/32]
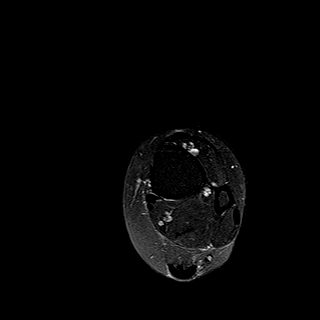

[Series 5: PD fat-sat · axial · 3.0mm · 0.42mm/px · z∈[-98,+23]mm · 9 of 32 slices shown]
[im 1/32]
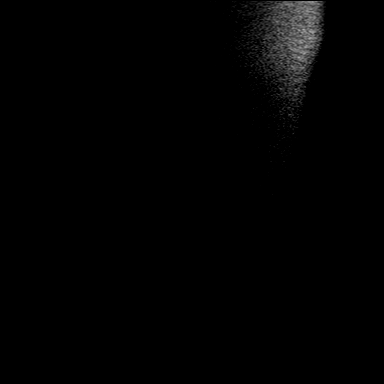
[im 4/32]
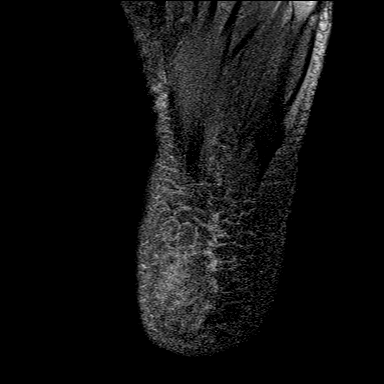
[im 8/32]
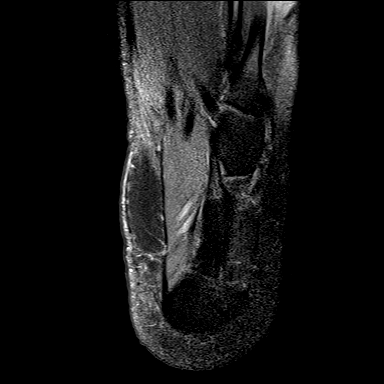
[im 12/32]
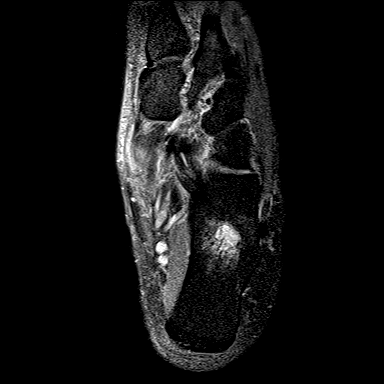
[im 16/32]
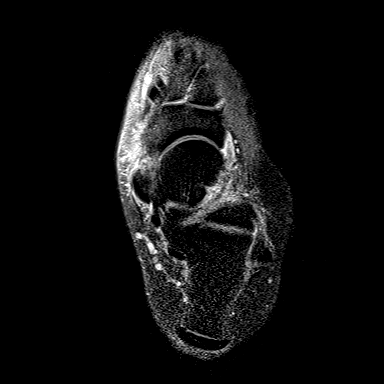
[im 20/32]
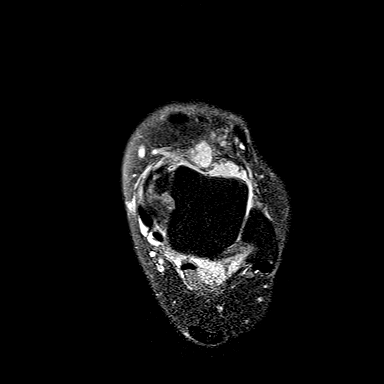
[im 24/32]
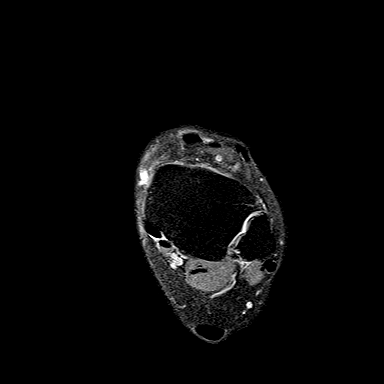
[im 28/32]
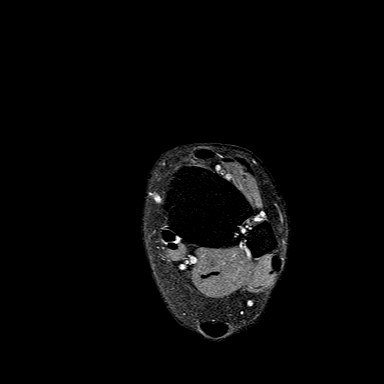
[im 32/32]
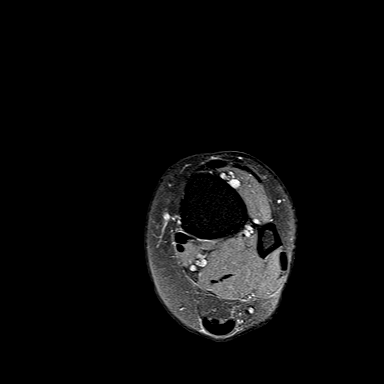

[Series 6: T1 · sagittal · 4.0mm · 0.56mm/px · 6 of 20 slices shown]
[im 1/20]
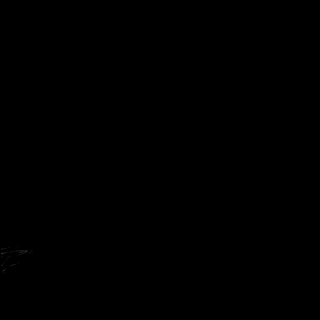
[im 4/20]
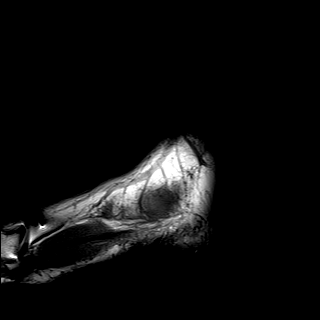
[im 8/20]
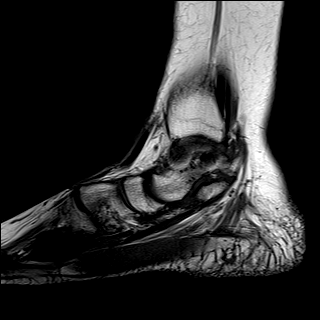
[im 12/20]
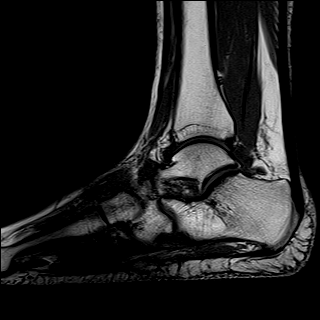
[im 16/20]
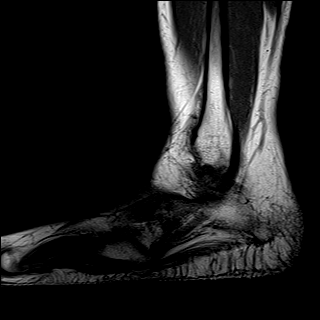
[im 20/20]
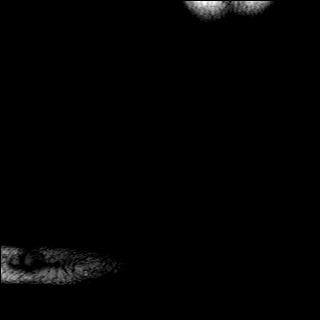

[Series 7: STIR · sagittal · 4.0mm · 0.35mm/px · 4 of 20 slices shown]
[im 1/20]
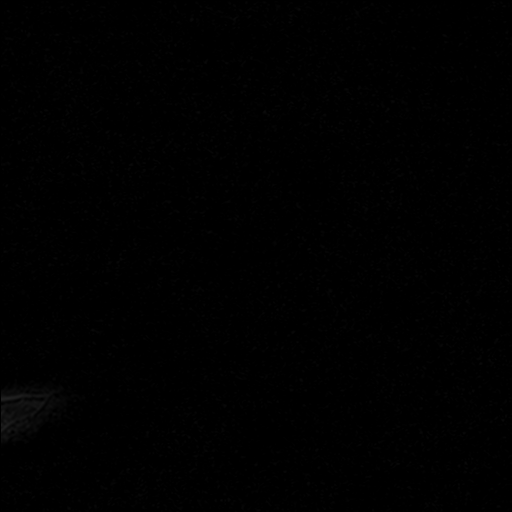
[im 4/20]
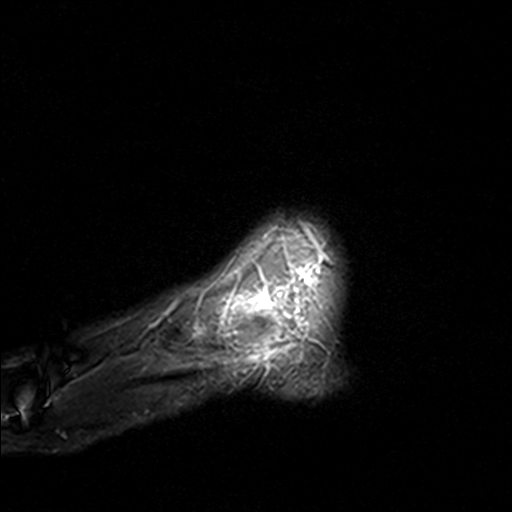
[im 8/20]
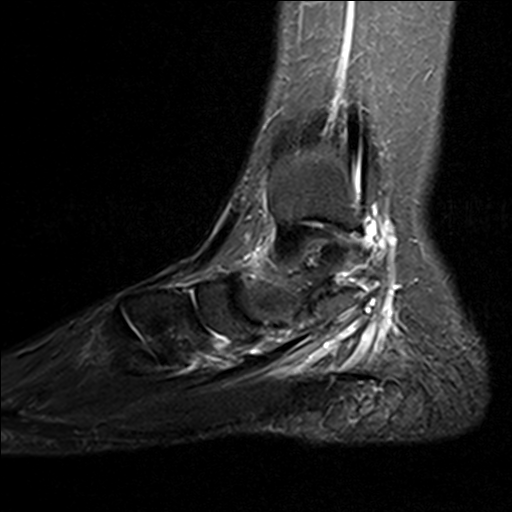
[im 12/20]
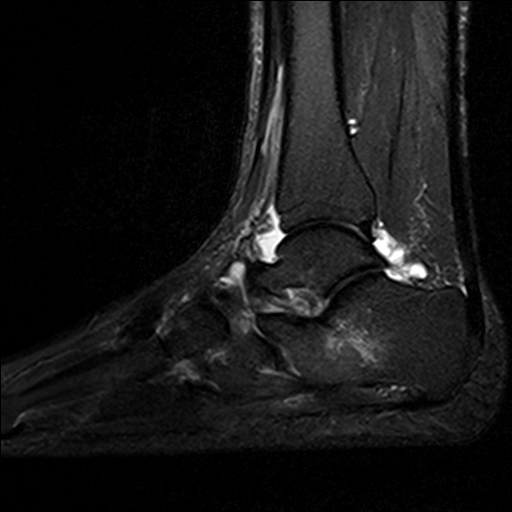

[Series 8: T2 fat-sat · coronal · 3.0mm · 0.50mm/px · 8 of 36 slices shown (2 of 2)]
[im 1/36]
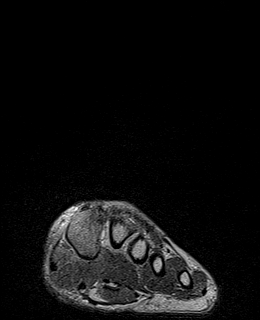
[im 4/36]
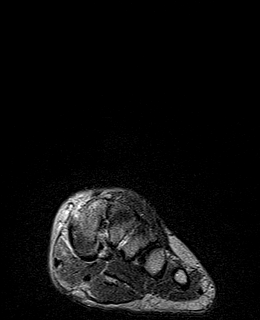
[im 12/36]
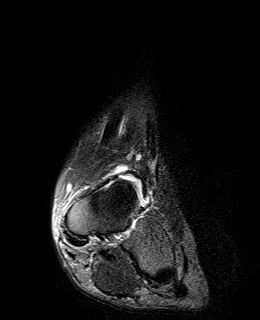
[im 16/36]
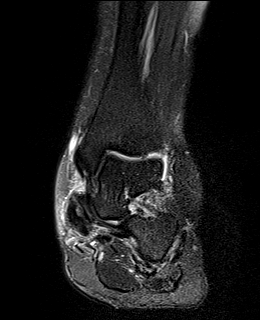
[im 20/36]
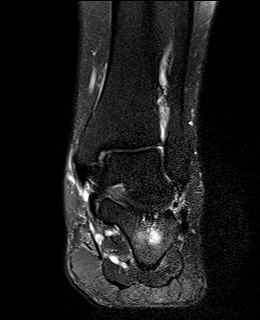
[im 24/36]
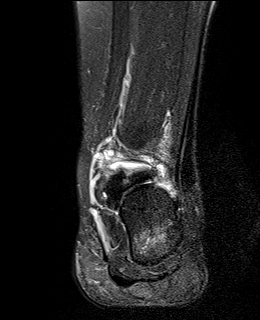
[im 32/36]
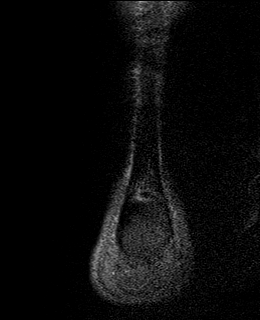
[im 36/36]
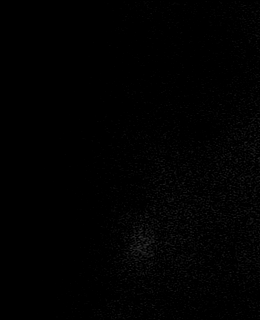

[36 of 40 positions shown; findings below may reference images not displayed]

FINDINGS: TENDONS

Peroneal: Peroneus longus and brevis tendons are intact and normally
positioned.

Posteromedial: Moderate tendinosis of the tibialis posterior tendon
distally with mild tenosynovitis. Flexor hallucis longus and flexor
digitorum longus tendons are intact.

Anterior: Tibialis anterior, extensor hallucis longus, and extensor
digitorum longus tendons are intact and normally positioned.

Achilles: Intact.

Plantar Fascia: Intact.

LIGAMENTS

Lateral: Intact tibiofibular ligaments. The anterior and posterior
talofibular ligaments are intact. Intact calcaneofibular ligament.

Medial: Intact deltoid ligament. Superomedial aspect of the spring
ligament complex is degenerated.

CARTILAGE AND BONES

Ankle Joint: Mild cartilage thinning at the medial talar shoulder.
Small tibiotalar joint effusion.

Subtalar Joints/Sinus Tarsi: Mild chondral thinning. Trace posterior
subtalar effusion. Partial loss of the anatomic fat within the sinus
tarsi.

Bones: Pes planus alignment. No acute fracture. No dislocation.
Small os navicular. There is a 11 mm bone cyst within the mid
calcaneal body. No suspicious bone lesion.

Other: Mild soft tissue edema along the medial aspect of the ankle
and midfoot.
IMPRESSION: 1. Moderate tendinosis of the tibialis posterior tendon distally
with mild tenosynovitis.
2. Degeneration of the superomedial aspect of the spring ligament
complex.
3. Pes planus alignment.
4. Mild degenerative changes of the tibiotalar and subtalar joints.
5. Partial loss of the anatomic fat within the sinus tarsi, which
can be seen in the setting of sinus tarsi syndrome.

## 2023-01-25 ENCOUNTER — Encounter: Payer: Self-pay | Admitting: Podiatry

## 2023-01-25 ENCOUNTER — Ambulatory Visit (INDEPENDENT_AMBULATORY_CARE_PROVIDER_SITE_OTHER): Payer: 59 | Admitting: Podiatry

## 2023-01-25 ENCOUNTER — Ambulatory Visit (INDEPENDENT_AMBULATORY_CARE_PROVIDER_SITE_OTHER): Payer: 59

## 2023-01-25 DIAGNOSIS — M76822 Posterior tibial tendinitis, left leg: Secondary | ICD-10-CM

## 2023-01-25 DIAGNOSIS — M7672 Peroneal tendinitis, left leg: Secondary | ICD-10-CM | POA: Diagnosis not present

## 2023-01-25 MED ORDER — TRIAMCINOLONE ACETONIDE 10 MG/ML IJ SUSP
10.0000 mg | Freq: Once | INTRAMUSCULAR | Status: AC
  Administered 2023-01-25: 10 mg

## 2023-01-26 NOTE — Progress Notes (Signed)
Subjective:   Patient ID: Jane Wong, female   DOB: 62 y.o.   MRN: [id number]   HPI Patient presents stating for the last month she has had a lot of pain in the outside of her left foot and she is very active plays a lot of sports and it has been hurting    ROS      Objective:  Physical Exam  Neurovascular status intact muscle strength adequate with patient found to have quite a bit of inflammation behind the lateral malleolus with fluid buildup in the area and pain with palpation.     Assessment:  Probability for inflammatory peroneal tendinitis left with inflammation and cannot rule out the possibility for interstitial tear of the tendon     Plan:  H&P reviewed and we will get a try to treat conservatively.  I did do sterile prep I injected the peroneal behind the malleolus explaining risk 3 mg Dexasone Kenalog 5 mg Xylocaine applied fascial brace to reduce inversion eversion and support the arch and reappoint 3 weeks may require MRI if symptoms persist and patient does have a boot at home and I encouraged immobilization for a few days  X-rays were negative for signs that this is bony pathology appears to be soft tissue at this time

## 2023-01-27 ENCOUNTER — Ambulatory Visit (HOSPITAL_COMMUNITY)
Admission: RE | Admit: 2023-01-27 | Discharge: 2023-01-27 | Disposition: A | Discharge status: Home / Self Care | Payer: 59 | Source: Ambulatory Visit | Attending: Family Medicine | Admitting: Family Medicine

## 2023-01-27 DIAGNOSIS — Z8249 Family history of ischemic heart disease and other diseases of the circulatory system: Secondary | ICD-10-CM | POA: Insufficient documentation

## 2023-02-15 ENCOUNTER — Encounter: Payer: Self-pay | Admitting: Podiatry

## 2023-02-15 ENCOUNTER — Ambulatory Visit (INDEPENDENT_AMBULATORY_CARE_PROVIDER_SITE_OTHER): Payer: 59 | Admitting: Podiatry

## 2023-02-15 DIAGNOSIS — M7672 Peroneal tendinitis, left leg: Secondary | ICD-10-CM | POA: Diagnosis not present

## 2023-02-15 DIAGNOSIS — M7671 Peroneal tendinitis, right leg: Secondary | ICD-10-CM | POA: Diagnosis not present

## 2023-02-15 DIAGNOSIS — M76822 Posterior tibial tendinitis, left leg: Secondary | ICD-10-CM | POA: Diagnosis not present

## 2023-02-16 NOTE — Progress Notes (Signed)
Subjective:   Patient ID: Jane Wong, female   DOB: 62 y.o.   MRN: [id number]   HPI Patient presents stating that she is improved left ankle but also does have instability is trying to get more active and is interested in new orthotics   ROS      Objective:  Physical Exam  Neuro vascular status intact with patient found to have improvement in the peroneal tendon after treatment that we performed several weeks ago with continued stress on the arch and history of posterior tibial repair     Assessment:  Doing better with injection treatment left and history of flatfoot deformity     Plan:  H&P reviewed and if symptoms recur will need a MRI but I am hopeful this will take care of but and were going to start her on new orthotics to try to lift up the arch and keep her in a consistent pattern.  Patient is casted today for new orthotics will be seen back when returned

## 2023-03-01 ENCOUNTER — Other Ambulatory Visit: Payer: Self-pay | Admitting: Family Medicine

## 2023-03-01 DIAGNOSIS — Z1231 Encounter for screening mammogram for malignant neoplasm of breast: Secondary | ICD-10-CM

## 2023-03-21 ENCOUNTER — Ambulatory Visit: Payer: 59

## 2023-03-21 DIAGNOSIS — M7672 Peroneal tendinitis, left leg: Secondary | ICD-10-CM

## 2023-03-21 NOTE — Progress Notes (Signed)
Patient was present fit with new CFO's Items fit well provide overall good support providing total contact to BIL MLA's reducing plantar pressure and pain   Patient was given wear care and break in instructions and will call office if any problems arise - Jane Wong Cped, Cfo, Cfm  [phone] Older pair left here for refurbish patient can PU when ready will do in house -TG

## 2023-04-18 ENCOUNTER — Ambulatory Visit
Admission: RE | Admit: 2023-04-18 | Discharge: 2023-04-18 | Disposition: A | Discharge status: Home / Self Care | Payer: 59 | Source: Ambulatory Visit | Attending: Family Medicine | Admitting: Family Medicine

## 2023-04-18 DIAGNOSIS — Z1231 Encounter for screening mammogram for malignant neoplasm of breast: Secondary | ICD-10-CM

## 2024-01-16 ENCOUNTER — Other Ambulatory Visit: Payer: Self-pay | Admitting: Family Medicine

## 2024-01-16 DIAGNOSIS — I77819 Aortic ectasia, unspecified site: Secondary | ICD-10-CM

## 2024-01-18 ENCOUNTER — Ambulatory Visit
Admission: RE | Admit: 2024-01-18 | Discharge: 2024-01-18 | Disposition: A | Discharge status: Home / Self Care | Source: Ambulatory Visit | Attending: Family Medicine | Admitting: Family Medicine

## 2024-01-18 ENCOUNTER — Encounter: Payer: Self-pay | Admitting: Radiology

## 2024-01-18 DIAGNOSIS — I77819 Aortic ectasia, unspecified site: Secondary | ICD-10-CM

## 2024-01-18 MED ORDER — IOPAMIDOL (ISOVUE-370) INJECTION 76%
75.0000 mL | Freq: Once | INTRAVENOUS | Status: AC | PRN
  Administered 2024-01-18: 75 mL via INTRAVENOUS

## 2024-03-18 ENCOUNTER — Telehealth: Payer: Self-pay

## 2024-03-18 NOTE — Telephone Encounter (Signed)
 Called pt to notify refurb orthotics are here.. pt stated she will pu when she's back in town.. $90 has been paid  (Orthotics have been here since 03/21/23)

## 2024-03-26 ENCOUNTER — Other Ambulatory Visit: Payer: Self-pay | Admitting: Family Medicine

## 2024-03-26 DIAGNOSIS — Z0000 Encounter for general adult medical examination without abnormal findings: Secondary | ICD-10-CM

## 2024-04-18 ENCOUNTER — Ambulatory Visit
Admission: RE | Admit: 2024-04-18 | Discharge: 2024-04-18 | Disposition: A | Discharge status: Home / Self Care | Source: Ambulatory Visit | Attending: Family Medicine | Admitting: Family Medicine

## 2024-04-18 DIAGNOSIS — Z0000 Encounter for general adult medical examination without abnormal findings: Secondary | ICD-10-CM
